# Patient Record
Sex: Female | Born: 1965 | Race: White | Hispanic: No | Smoking: Current every day smoker
Health system: Southern US, Community
[De-identification: ages and names within clinical notes are randomized; demographics above are authoritative.]

## PROBLEM LIST (undated history)

## (undated) DIAGNOSIS — G43909 Migraine, unspecified, not intractable, without status migrainosus: Secondary | ICD-10-CM

## (undated) DIAGNOSIS — R519 Headache, unspecified: Secondary | ICD-10-CM

## (undated) DIAGNOSIS — E785 Hyperlipidemia, unspecified: Secondary | ICD-10-CM

## (undated) DIAGNOSIS — E119 Type 2 diabetes mellitus without complications: Secondary | ICD-10-CM

## (undated) DIAGNOSIS — R51 Headache: Secondary | ICD-10-CM

## (undated) DIAGNOSIS — F329 Major depressive disorder, single episode, unspecified: Secondary | ICD-10-CM

## (undated) DIAGNOSIS — F32A Depression, unspecified: Secondary | ICD-10-CM

## (undated) DIAGNOSIS — F419 Anxiety disorder, unspecified: Secondary | ICD-10-CM

## (undated) DIAGNOSIS — R232 Flushing: Secondary | ICD-10-CM

## (undated) DIAGNOSIS — K219 Gastro-esophageal reflux disease without esophagitis: Secondary | ICD-10-CM

## (undated) HISTORY — PX: COLONOSCOPY: SHX174

## (undated) HISTORY — DX: Major depressive disorder, single episode, unspecified: F32.9

## (undated) HISTORY — DX: Headache: R51

## (undated) HISTORY — DX: Hyperlipidemia, unspecified: E78.5

## (undated) HISTORY — DX: Migraine, unspecified, not intractable, without status migrainosus: G43.909

## (undated) HISTORY — DX: Depression, unspecified: F32.A

## (undated) HISTORY — PX: FOOT SURGERY: SHX648

## (undated) HISTORY — DX: Flushing: R23.2

## (undated) HISTORY — DX: Anxiety disorder, unspecified: F41.9

## (undated) HISTORY — DX: Gastro-esophageal reflux disease without esophagitis: K21.9

## (undated) HISTORY — DX: Headache, unspecified: R51.9

---

## 2004-04-08 ENCOUNTER — Ambulatory Visit: Payer: Self-pay

## 2008-12-24 ENCOUNTER — Ambulatory Visit: Payer: Self-pay

## 2009-07-01 ENCOUNTER — Ambulatory Visit: Payer: Self-pay | Admitting: Neurology

## 2009-09-02 ENCOUNTER — Ambulatory Visit: Payer: Self-pay

## 2009-09-11 ENCOUNTER — Emergency Department: Payer: Self-pay

## 2009-09-15 ENCOUNTER — Inpatient Hospital Stay: Payer: Self-pay

## 2010-04-03 HISTORY — PX: ABDOMINAL HYSTERECTOMY: SHX81

## 2012-01-11 ENCOUNTER — Ambulatory Visit: Payer: Self-pay | Admitting: Podiatry

## 2013-03-07 ENCOUNTER — Emergency Department: Payer: Self-pay | Admitting: Emergency Medicine

## 2013-03-07 LAB — COMPREHENSIVE METABOLIC PANEL
Albumin: 4 g/dL (ref 3.4–5.0)
Alkaline Phosphatase: 95 U/L
Anion Gap: 9 (ref 7–16)
BUN: 5 mg/dL — ABNORMAL LOW (ref 7–18)
Calcium, Total: 9.5 mg/dL (ref 8.5–10.1)
Creatinine: 0.88 mg/dL (ref 0.60–1.30)
EGFR (African American): >60
Potassium: 3.9 mmol/L (ref 3.5–5.1)
SGPT (ALT): 35 U/L (ref 12–78)
Total Protein: 8.1 g/dL (ref 6.4–8.2)

## 2013-03-07 LAB — TSH: Thyroid Stimulating Horm: 2.14 u[IU]/mL

## 2013-03-07 LAB — CBC
HCT: 43.8 % (ref 35.0–47.0)
HGB: 14.6 g/dL (ref 12.0–16.0)
MCHC: 33.3 g/dL (ref 32.0–36.0)
Platelet: 273 10*3/uL (ref 150–440)
WBC: 8.4 10*3/uL (ref 3.6–11.0)

## 2013-03-07 LAB — DRUG SCREEN, URINE
Barbiturates, Ur Screen: NEGATIVE (ref ?–200)
Cocaine Metabolite,Ur ~~LOC~~: NEGATIVE (ref ?–300)
Opiate, Ur Screen: NEGATIVE (ref ?–300)

## 2013-03-07 LAB — ETHANOL: Ethanol %: <0.003 % (ref 0.000–0.080)

## 2013-03-07 LAB — SALICYLATE LEVEL: Salicylates, Serum: 4.4 mg/dL — ABNORMAL HIGH

## 2013-10-27 ENCOUNTER — Encounter: Payer: Self-pay | Admitting: Specialist

## 2013-11-01 ENCOUNTER — Encounter: Payer: Self-pay | Admitting: Specialist

## 2013-12-02 ENCOUNTER — Encounter: Payer: Self-pay | Admitting: Specialist

## 2013-12-24 ENCOUNTER — Ambulatory Visit: Payer: Self-pay | Admitting: Specialist

## 2013-12-26 ENCOUNTER — Emergency Department: Payer: Self-pay | Admitting: Emergency Medicine

## 2014-01-01 ENCOUNTER — Encounter: Payer: Self-pay | Admitting: Specialist

## 2014-07-24 NOTE — Consult Note (Signed)
PATIENT NAME:  Sonya Evans, Sonya Evans MR#:  [id number] DATE OF BIRTH:  Dec 02, 1965  DATE OF CONSULTATION:  03/07/2013  CONSULTING PHYSICIAN:  Audery AmelJohn T. Landen Knoedler, MD  IDENTIFYING INFORMATION AND REASON FOR CONSULT: This is a 49 year old woman with a history of major depression, came to the Emergency Room complaining of worsening depression. Consultation for psychiatric evaluation.   HISTORY OF PRESENT ILLNESS: Information obtained from the patient and the chart. The patient has been suffering from depression for years. Mood stays down. Thoughts are frequently negative. Energy low. Crying spells not infrequently. Some hopelessness. She has had passive thoughts about wishing that she were dead, but denies having any thoughts of ever actually harming herself and has a lot of clarity about wanting to live for her daughter. She does not report any hallucinations or psychotic symptoms. She has only seen her primary care doctor so far for psychiatric treatment and is taking Celexa, Seroquel, and clonazepam. She has been referred to see a psychiatrist but has not had an appointment yet. Just recently started seeing a Librarian, academicChristian counselor. A recent major stress was a change in her work environment, which has put her in a more stressful situation.   PAST PSYCHIATRIC HISTORY: Treatment for major depression has included medicines such as Wellbutrin and Zoloft and Xanax as well as her current medicines. Denies psychiatric hospitalization. Denies suicide attempts. Denies history of psychotic disorder. Just recently got referred to see mental health professionals.   PAST MEDICAL HISTORY: The patient has irritable bowel syndrome, chronic migraines. No other significant medical problems.   SOCIAL HISTORY: The patient is living alone right now. She had a terrible trauma a few years ago when her son was murdered in a murder suicide by his own friend. Her father apparently died around the same time. She also had a brother who died.  The patient has been depressed since about 2009. She works for  [employer]   FAMILY HISTORY: Mother had a history of depression.   SUBSTANCE ABUSE HISTORY: The patient does not drink regularly, does not have a history of alcohol problems. No history of drug abuse.   REVIEW OF SYSTEMS: Migraine headaches. Back pain. Chronic sadness, low energy, passive hopelessness. Denies hallucinations. Denies suicidal intent or plan. Denies homicidal ideation.   CURRENT MEDICATIONS: Celexa, Klonopin, and Seroquel. I am not clear on the doses. Also p.Evans.n. Phenergan.   ALLERGIES: No known drug allergies.   MENTAL STATUS EXAMINATION: Slightly disheveled woman, looks her stated age, cooperative with the interview but very passive. Made intermittent eye contact. Psychomotor activity was very limited. She did not even sit up from her bed when I talked with her. Speech is quiet and somewhat difficult to understand at times, decreased in total amount. Affect is sad,  blunted, and tearful. Mood stated as depressed. Thoughts appear lucid with no evidence of loosening of associations or delusions. Denies auditory or visual hallucinations. Denies suicidal intent or plan. Has passive thoughts about death but no suicidality, no homicidality. Judgment and insight adequate. Normal intelligence. Alert and oriented x 4.   VITAL SIGNS: Most recent blood pressure 138/70, respirations 18, pulse 78, temperature 98.1.   LABORATORY RESULTS: Drug screen negative. TSH normal. CBC normal. Alcohol undetectable. Chemistry unremarkable.   ASSESSMENT: A 49 year old woman with major depression, severe, but without suicidality and without psychosis, who has been still managing to work but has multiple life stresses. Right now, the patient does not meet criteria for inpatient hospitalization. She does need an increase in the intensity of  her treatment and referral to mental health providers.   TREATMENT PLAN: We have made an appointment for her  to see Dr. Garnetta Buddy here at the hospital practice on Monday. The patient will be provided with this appointment. She can continue to see her Librarian, academic. Supportive and educational therapy was done, reviewing treatment options and encouraging the patient to follow up with treatment. Continue with therapy. Do not stop current medications for now. The patient is not under IVC, and I recommend she can be released from the Emergency Room as she has a safe place to stay.   DIAGNOSIS, PRINCIPAL AND PRIMARY:  AXIS I: Major depression, severe, recurrent.   SECONDARY DIAGNOSES: AXIS I: No further diagnosis.  AXIS II: No diagnosis.  AXIS III: Migraines, irritable bowel syndrome.  AXIS IV: Moderate to severe stress from employment problems and chronic reaction to grief.  AXIS V: Functioning at time of evaluation 57.    ____________________________ Audery Amel, MD jtc:jcm D: 03/07/2013 16:32:14 ET T: 03/07/2013 17:30:27 ET JOB#: [id number]  cc: Audery Amel, MD, <Dictator> Audery Amel MD ELECTRONICALLY SIGNED 03/07/2013 18:45

## 2014-07-24 NOTE — Consult Note (Signed)
Brief Consult Note: Diagnosis: major depression.   Patient was seen by consultant.   Consult note dictated.   Recommend further assessment or treatment.   Discussed with Attending MD.   Comments: Psychiatry: Patient seen. Major depression very sad but denies any actual intent or plan to harm herself and has positive reasons to live. Not needing inpt treatment. [facility]  Electronic Signatures: Clapacs, Jackquline DenmarkJohn T (MD)  (Signed 05-Dec-14 16:25)  Authored: Brief Consult Note   Last Updated: 05-Dec-14 16:25 by Audery Amellapacs, John T (MD)

## 2014-10-02 DIAGNOSIS — F32A Depression, unspecified: Secondary | ICD-10-CM | POA: Insufficient documentation

## 2014-10-02 DIAGNOSIS — F329 Major depressive disorder, single episode, unspecified: Secondary | ICD-10-CM | POA: Insufficient documentation

## 2014-10-02 DIAGNOSIS — M79603 Pain in arm, unspecified: Secondary | ICD-10-CM | POA: Insufficient documentation

## 2014-10-02 DIAGNOSIS — G8929 Other chronic pain: Secondary | ICD-10-CM | POA: Insufficient documentation

## 2014-10-02 DIAGNOSIS — M549 Dorsalgia, unspecified: Secondary | ICD-10-CM

## 2014-10-02 DIAGNOSIS — F419 Anxiety disorder, unspecified: Secondary | ICD-10-CM | POA: Insufficient documentation

## 2015-08-09 ENCOUNTER — Other Ambulatory Visit: Payer: Self-pay | Admitting: Family Medicine

## 2015-08-09 ENCOUNTER — Telehealth: Payer: Self-pay | Admitting: Family Medicine

## 2015-08-09 ENCOUNTER — Encounter: Payer: Self-pay | Admitting: Family Medicine

## 2015-08-09 ENCOUNTER — Ambulatory Visit (INDEPENDENT_AMBULATORY_CARE_PROVIDER_SITE_OTHER): Payer: BLUE CROSS/BLUE SHIELD | Admitting: Family Medicine

## 2015-08-09 VITALS — BP 136/78 | HR 92 | Temp 98.1°F | Resp 16 | Ht 66.0 in | Wt 180.0 lb

## 2015-08-09 DIAGNOSIS — R232 Flushing: Secondary | ICD-10-CM | POA: Insufficient documentation

## 2015-08-09 DIAGNOSIS — N951 Menopausal and female climacteric states: Secondary | ICD-10-CM | POA: Diagnosis not present

## 2015-08-09 DIAGNOSIS — K219 Gastro-esophageal reflux disease without esophagitis: Secondary | ICD-10-CM

## 2015-08-09 DIAGNOSIS — Z720 Tobacco use: Secondary | ICD-10-CM | POA: Diagnosis not present

## 2015-08-09 DIAGNOSIS — M25562 Pain in left knee: Secondary | ICD-10-CM | POA: Diagnosis not present

## 2015-08-09 DIAGNOSIS — F17200 Nicotine dependence, unspecified, uncomplicated: Secondary | ICD-10-CM | POA: Insufficient documentation

## 2015-08-09 DIAGNOSIS — E785 Hyperlipidemia, unspecified: Secondary | ICD-10-CM

## 2015-08-09 DIAGNOSIS — F329 Major depressive disorder, single episode, unspecified: Secondary | ICD-10-CM | POA: Diagnosis not present

## 2015-08-09 DIAGNOSIS — F32A Depression, unspecified: Secondary | ICD-10-CM

## 2015-08-09 MED ORDER — MIRTAZAPINE 45 MG PO TABS: 45.0000 mg | ORAL_TABLET | Freq: Every day | ORAL | Status: DC

## 2015-08-09 MED ORDER — QUETIAPINE FUMARATE ER 150 MG PO TB24: 150.0000 mg | ORAL_TABLET | Freq: Every day | ORAL | Status: DC

## 2015-08-09 MED ORDER — NAPROXEN 500 MG PO TABS: 500.0000 mg | ORAL_TABLET | Freq: Two times a day (BID) | ORAL | Status: DC

## 2015-08-09 MED ORDER — QUETIAPINE FUMARATE 300 MG PO TABS: 300.0000 mg | ORAL_TABLET | Freq: Every day | ORAL | Status: DC

## 2015-08-09 MED ORDER — OMEPRAZOLE 20 MG PO CPDR: 20.0000 mg | DELAYED_RELEASE_CAPSULE | Freq: Every day | ORAL | Status: DC

## 2015-08-09 MED ORDER — GABAPENTIN 100 MG PO CAPS: ORAL_CAPSULE | ORAL | Status: DC

## 2015-08-09 NOTE — Assessment & Plan Note (Signed)
Trial of gabapentin for hot flash symptoms. Start 100mg  nightly and titrate up to 300mg  PRN. Recheck 1 mos for efficacy and titration.

## 2015-08-09 NOTE — Telephone Encounter (Signed)
Per [facility], her insurance won't cover 300mg  tablet if only taking 1/2. Will change to 150mg  tablet.

## 2015-08-09 NOTE — Assessment & Plan Note (Signed)
Unclear if patient is still following with psychiatry. Will fill medications today. Strongly urged patient to follow with psychiatry given her current medications.  Check TSH, Vitamin D for exacerbating symptoms.

## 2015-08-09 NOTE — Telephone Encounter (Signed)
Maggie from [facility] need  Clarification  On  Medicine . Call back  #is  [phone]

## 2015-08-09 NOTE — Progress Notes (Signed)
Subjective:    Patient ID: Sonya Evans, female    DOB: Apr 28, 1965, 50 y.o.   MRN: [id number]  HPI: Sonya Evans is a 50 y.o. female presenting on 08/09/2015 for Establish Care   HPI  Pt presents to establish care today. Previous care provider was Dr. Jeananne Rama [phone].  It has been 2 years years since Her last PCP visit. Records from previous provider will be requested and reviewed. Current medical problems include:  Depression: Was seeing Dr. Miles Costain at [facility]- was started on Seroquel and Remeron- she reports from Dr. Jeananne Rama. She was previously clonazepam for depression. Medications were started when son was killed. Pt reports Dr. Miles Costain told her she must get a primary care provider to prescribe her medications. It is unclear if she will continue to follow with psychiatry.  Insomnia- takes seroquel for bedtime at 7pm. Drinks 1 soda at 5pm.  Wakes up in the middle of night with hot flashes. Hot flashes are 3 at night. 2-3 at night. 3 during the day. Hot flashes started 2014. Have stayed about the same. Has tried black cohash- made it worse. Had taken clonidine for hot flashes- did not help.   Acid reflux- Drinks or eats something it burns. Taking nexium 24 hour OTC- seems to help. Some dysphagia. No regurg. No blood in stool or vomit.   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  Last [facility] smear 2014/2015[facility]- Not seeing [facility].  Colonoscopy: Rectal bleeding- was told she had IBS- did an endoscopy and colonoscopy- had a polyp in stomach- was removed. Normal colonoscopy.     Past Medical History  Diagnosis Date  . Depression   . Headache   . Anxiety   . Acid reflux   . Hyperlipidemia   . Migraine   . Hot flashes    Social History   Social History  . Marital Status: Divorced    Spouse Name: N/A  . Number of Children: N/A  . Years of Education: N/A   Occupational History  . Not on file.   Social History Main Topics  .  Smoking status: Current Every Day Smoker -- 1.00 packs/day    Types: Cigarettes  . Smokeless tobacco: Not on file  . Alcohol Use: No  . Drug Use: No  . Sexual Activity: Not on file   Other Topics Concern  . Not on file   Social History Narrative  . No narrative on file   Family History  Problem Relation Age of Onset  . Hyperlipidemia Mother   . Neuropathy Mother   . Arthritis Father   . Hyperlipidemia Brother   . Cancer Maternal Grandmother     breast   . Alzheimer's disease Paternal Grandmother    No current outpatient prescriptions on file prior to visit.   No current facility-administered medications on file prior to visit.    Review of Systems  Constitutional: Negative for fever and chills.  HENT: Negative.   Respiratory: Negative for cough, chest tightness and wheezing.   Cardiovascular: Negative for chest pain and leg swelling.  Gastrointestinal: Negative for nausea, vomiting, abdominal pain, diarrhea and constipation.       Acid reflux.   Endocrine: Negative.  Negative for cold intolerance, heat intolerance, polydipsia, polyphagia and polyuria.       Hot flashes  Genitourinary: Negative for dysuria and difficulty urinating.  Musculoskeletal: Negative.   Neurological: Negative for dizziness, light-headedness and numbness.  Psychiatric/Behavioral: Positive for dysphoric mood.  Per HPI unless specifically indicated above     Objective:    BP 136/78 mmHg  Pulse 92  Temp(Src) 98.1 F (36.7 C) (Oral)  Resp 16  Ht '5\' 6"'$  (1.676 m)  Wt 180 lb (81.647 kg)  BMI 29.07 kg/m2  Wt Readings from Last 3 Encounters:  08/09/15 180 lb (81.647 kg)    Physical Exam  Constitutional: She is oriented to person, place, and time. She appears well-developed and well-nourished.  HENT:  Head: Normocephalic and atraumatic.  Neck: Neck supple.  Cardiovascular: Normal rate, regular rhythm and normal heart sounds.  Exam reveals no gallop and no friction rub.   No murmur  heard. Pulmonary/Chest: Effort normal and breath sounds normal. She has no wheezes. She exhibits no tenderness.  Abdominal: Soft. Normal appearance and bowel sounds are normal. She exhibits no distension and no mass. There is no tenderness. There is no rebound and no guarding.  Musculoskeletal: Normal range of motion. She exhibits no edema or tenderness.  Lymphadenopathy:    She has no cervical adenopathy.  Neurological: She is alert and oriented to person, place, and time.  Skin: Skin is warm and dry.  Psychiatric: She has a normal mood and affect. Her speech is normal and behavior is normal. Judgment and thought content normal. Cognition and memory are normal.   Results for orders placed or performed in visit on 03/07/13  Drug Screen, Urine  Result Value Ref Range   Tricyclic, Ur Screen NEGATIVE Cutoff-1000 ng/mL   Amphetamines, Ur Screen NEGATIVE Cutoff-1000 ng/mL   MDMA (Ecstasy)Ur Screen NEGATIVE Cutoff-500 ng/mL   Cocaine Metabolite,Ur [place] NEGATIVE Cutoff-300 ng/mL   Opiate, Ur Screen NEGATIVE Cutoff-300 ng/mL   Phencyclidine (PCP) Ur S NEGATIVE Cutoff-25 ng/mL   Cannabinoid 50 Ng, Ur [place] NEGATIVE Cutoff-50 ng/mL   Barbiturates, Ur Screen NEGATIVE Cutoff-200 ng/mL   Methadone, Ur Screen NEGATIVE Cutoff-300 ng/mL   Benzodiazepine, Ur Scrn NEGATIVE Cutoff-200 ng/mL  Acetaminophen level  Result Value Ref Range   Acetaminophen < 2 mcg/mL  Comprehensive metabolic panel  Result Value Ref Range   Glucose 92 65-99 mg/dL   BUN 5 (L) 7-18 mg/dL   Creatinine 0.88 0.60-1.30 mg/dL   Sodium 138 136-145 mmol/L   Potassium 3.9 3.5-5.1 mmol/L   Chloride 104 98-107 mmol/L   Co2 25 21-32 mmol/L   Calcium, Total 9.5 8.5-10.1 mg/dL   SGOT(AST) 32 15-37 Unit/L   SGPT (ALT) 35 12-78 U/L   Alkaline Phosphatase 95 Unit/L   Albumin 4.0 3.4-5.0 g/dL   Total Protein 8.1 6.4-8.2 g/dL   Bilirubin,Total 0.3 0.2-1.0 mg/dL   Osmolality 273 275-301   Anion Gap 9 7-16   EGFR (African American) >60     EGFR (Non-African Amer.) >60   Ethanol  Result Value Ref Range   Ethanol < 3 mg/dL   Ethanol % < 0.003 0.000-0.080 %  CBC  Result Value Ref Range   WBC 8.4 3.6-11.0 x10 3/mm 3   RBC 5.15 3.80-5.20 X10 6/mm 3   HGB 14.6 12.0-16.0 g/dL   HCT 43.8 35.0-47.0 %   MCV 85 80-100 fL   MCH 28.3 26.0-34.0 pg   MCHC 33.3 32.0-36.0 g/dL   RDW 15.2 (H) 11.5-14.5 %   Platelet 273 607-371 G62 3/mm 3  Salicylate level  Result Value Ref Range   Salicylates, Serum 4.4 (H) mg/dL  TSH  Result Value Ref Range   Thyroid Stimulating Horm 2.14 uIU/mL      Assessment & Plan:   Problem List Items Addressed This  Visit      Digestive   GERD (gastroesophageal reflux disease)    Start PPI today. Avoid diet triggers. Check CBC. Alarm symptoms reviewed. Consider EGD with continued symptoms. Recheck 1 mos.       Relevant Medications   omeprazole (PRILOSEC) 20 MG capsule   Other Relevant Orders   CBC with Differential/Platelet   Comprehensive metabolic panel     Other   HLD (hyperlipidemia)    Check baseline lipid panel.       Relevant Orders   Lipid Profile   Clinical depression - Primary    Unclear if patient is still following with psychiatry. Will fill medications today. Strongly urged patient to follow with psychiatry given her current medications.  Check TSH, Vitamin D for exacerbating symptoms.       Relevant Medications   mirtazapine (REMERON) 45 MG tablet   Other Relevant Orders   TSH   VITAMIN D 25 Hydroxy (Vit-D Deficiency, Fractures)   Hot flashes    Trial of gabapentin for hot flash symptoms. Start '100mg'$  nightly and titrate up to '300mg'$  PRN. Recheck 1 mos for efficacy and titration.       Relevant Medications   gabapentin (NEURONTIN) 100 MG capsule   Smoker    Encouraged pt to quit smoking.        Other Visit Diagnoses    Lateral knee pain, left        Relevant Medications    naproxen (NAPROSYN) 500 MG tablet       Meds ordered this encounter  Medications  .  DISCONTD: QUEtiapine (SEROQUEL) 300 MG tablet    Sig: Take 300 mg by mouth at bedtime. Pt takes only half pill at bed time.  Marland Kitchen DISCONTD: QUEtiapine (SEROQUEL) 300 MG tablet    Sig: Take 1 tablet (300 mg total) by mouth at bedtime. Pt takes only half pill at bed time.    Dispense:  30 tablet    Refill:  11    Order Specific Question:  Supervising Provider    Answer:  Arlis Porta [phone]  . mirtazapine (REMERON) 45 MG tablet    Sig: Take 1 tablet (45 mg total) by mouth daily.    Dispense:  30 tablet    Refill:  1    Order Specific Question:  Supervising Provider    Answer:  Arlis Porta [phone]  . omeprazole (PRILOSEC) 20 MG capsule    Sig: Take 1 capsule (20 mg total) by mouth daily.    Dispense:  30 capsule    Refill:  3    Order Specific Question:  Supervising Provider    Answer:  Arlis Porta [phone]  . gabapentin (NEURONTIN) 100 MG capsule    Sig: Take '300mg'$  at bedtime.    Dispense:  90 capsule    Refill:  3    Order Specific Question:  Supervising Provider    Answer:  Arlis Porta [phone]  . naproxen (NAPROSYN) 500 MG tablet    Sig: Take 1 tablet (500 mg total) by mouth 2 (two) times daily with a meal.    Dispense:  30 tablet    Refill:  0    Order Specific Question:  Supervising Provider    Answer:  Arlis Porta [[id number]]      Follow up plan: Return in about 4 weeks (around 09/06/2015), or if symptoms worsen or fail to improve, for hot flashes.Marland Kitchen

## 2015-08-09 NOTE — Assessment & Plan Note (Signed)
Check baseline lipid panel.  

## 2015-08-09 NOTE — Assessment & Plan Note (Signed)
Encouraged pt to quit smoking

## 2015-08-09 NOTE — Patient Instructions (Signed)
Hot flashes: Take 100mg  at bedtime. Increase by 1 capsule every 3 days as needed for hot flashes until you are taking 3 capsules at bedtime. Can add 1 capsule in the morning after 1 week if needed.   We will check some labwork. I have filled your seroquel and remeron as needed for depression.

## 2015-08-09 NOTE — Assessment & Plan Note (Signed)
Start PPI today. Avoid diet triggers. Check CBC. Alarm symptoms reviewed. Consider EGD with continued symptoms. Recheck 1 mos.

## 2015-08-10 LAB — LIPID PANEL
CHOLESTEROL TOTAL: 259 mg/dL — AB (ref 100–199)
Chol/HDL Ratio: 6.6 ratio units — ABNORMAL HIGH (ref 0.0–4.4)
HDL: 39 mg/dL — ABNORMAL LOW (ref >39)
LDL CALC: 157 mg/dL — AB (ref 0–99)
TRIGLYCERIDES: 314 mg/dL — AB (ref 0–149)
VLDL Cholesterol Cal: 63 mg/dL — ABNORMAL HIGH (ref 5–40)

## 2015-08-10 LAB — COMPREHENSIVE METABOLIC PANEL
A/G RATIO: 1.7 (ref 1.2–2.2)
ALBUMIN: 4.6 g/dL (ref 3.5–5.5)
ALT: 13 IU/L (ref 0–32)
AST: 18 IU/L (ref 0–40)
Alkaline Phosphatase: 133 IU/L — ABNORMAL HIGH (ref 39–117)
BUN/Creatinine Ratio: 6 — ABNORMAL LOW (ref 9–23)
BUN: 5 mg/dL — ABNORMAL LOW (ref 6–24)
Bilirubin Total: 0.3 mg/dL (ref 0.0–1.2)
CALCIUM: 9.4 mg/dL (ref 8.7–10.2)
CO2: 22 mmol/L (ref 18–29)
CREATININE: 0.77 mg/dL (ref 0.57–1.00)
Chloride: 98 mmol/L (ref 96–106)
GFR, EST AFRICAN AMERICAN: 106 mL/min/{1.73_m2} (ref >59)
GFR, EST NON AFRICAN AMERICAN: 92 mL/min/{1.73_m2} (ref >59)
GLOBULIN, TOTAL: 2.7 g/dL (ref 1.5–4.5)
GLUCOSE: 98 mg/dL (ref 65–99)
Potassium: 4.6 mmol/L (ref 3.5–5.2)
SODIUM: 139 mmol/L (ref 134–144)
TOTAL PROTEIN: 7.3 g/dL (ref 6.0–8.5)

## 2015-08-10 LAB — CBC WITH DIFFERENTIAL/PLATELET
BASOS ABS: 0.1 10*3/uL (ref 0.0–0.2)
BASOS: 1 %
EOS (ABSOLUTE): 0.2 10*3/uL (ref 0.0–0.4)
Eos: 2 %
HEMOGLOBIN: 14.3 g/dL (ref 11.1–15.9)
Hematocrit: 42.3 % (ref 34.0–46.6)
IMMATURE GRANS (ABS): 0 10*3/uL (ref 0.0–0.1)
IMMATURE GRANULOCYTES: 0 %
LYMPHS: 41 %
Lymphocytes Absolute: 3.7 10*3/uL — ABNORMAL HIGH (ref 0.7–3.1)
MCH: 28.5 pg (ref 26.6–33.0)
MCHC: 33.8 g/dL (ref 31.5–35.7)
MCV: 84 fL (ref 79–97)
MONOCYTES: 7 %
Monocytes Absolute: 0.6 10*3/uL (ref 0.1–0.9)
NEUTROS ABS: 4.5 10*3/uL (ref 1.4–7.0)
NEUTROS PCT: 49 %
PLATELETS: 303 10*3/uL (ref 150–379)
RBC: 5.02 x10E6/uL (ref 3.77–5.28)
RDW: 14.1 % (ref 12.3–15.4)
WBC: 9 10*3/uL (ref 3.4–10.8)

## 2015-08-10 LAB — VITAMIN D 25 HYDROXY (VIT D DEFICIENCY, FRACTURES): Vit D, 25-Hydroxy: 24.1 ng/mL — ABNORMAL LOW (ref 30.0–100.0)

## 2015-08-10 LAB — TSH: TSH: 1.9 u[IU]/mL (ref 0.450–4.500)

## 2015-08-11 ENCOUNTER — Other Ambulatory Visit: Payer: Self-pay | Admitting: Family Medicine

## 2015-08-11 MED ORDER — VITAMIN D (ERGOCALCIFEROL) 1.25 MG (50000 UNIT) PO CAPS: 50000.0000 [IU] | ORAL_CAPSULE | ORAL | Status: DC

## 2015-08-12 ENCOUNTER — Other Ambulatory Visit: Payer: Self-pay | Admitting: Family Medicine

## 2015-08-12 MED ORDER — ATORVASTATIN CALCIUM 20 MG PO TABS: 20.0000 mg | ORAL_TABLET | Freq: Every day | ORAL | Status: DC

## 2015-08-13 LAB — SPECIMEN STATUS REPORT

## 2015-08-13 LAB — GAMMA GT: GGT: 49 IU/L (ref 0–60)

## 2015-09-11 ENCOUNTER — Other Ambulatory Visit: Payer: Self-pay | Admitting: Family Medicine

## 2015-09-16 ENCOUNTER — Ambulatory Visit (INDEPENDENT_AMBULATORY_CARE_PROVIDER_SITE_OTHER): Payer: BLUE CROSS/BLUE SHIELD | Admitting: Family Medicine

## 2015-09-16 VITALS — BP 124/74 | HR 87 | Temp 98.6°F | Resp 16 | Ht 66.0 in | Wt 185.0 lb

## 2015-09-16 DIAGNOSIS — N898 Other specified noninflammatory disorders of vagina: Secondary | ICD-10-CM

## 2015-09-16 DIAGNOSIS — R748 Abnormal levels of other serum enzymes: Secondary | ICD-10-CM

## 2015-09-16 DIAGNOSIS — M25561 Pain in right knee: Secondary | ICD-10-CM | POA: Diagnosis not present

## 2015-09-16 DIAGNOSIS — Z7251 High risk heterosexual behavior: Secondary | ICD-10-CM

## 2015-09-16 DIAGNOSIS — M25562 Pain in left knee: Secondary | ICD-10-CM | POA: Diagnosis not present

## 2015-09-16 DIAGNOSIS — N951 Menopausal and female climacteric states: Secondary | ICD-10-CM

## 2015-09-16 DIAGNOSIS — R202 Paresthesia of skin: Secondary | ICD-10-CM

## 2015-09-16 DIAGNOSIS — R232 Flushing: Secondary | ICD-10-CM

## 2015-09-16 LAB — POCT WET PREP (WET MOUNT): CLUE CELLS WET PREP WHIFF POC: NEGATIVE

## 2015-09-16 MED ORDER — MELOXICAM 15 MG PO TABS: 15.0000 mg | ORAL_TABLET | Freq: Every day | ORAL | Status: DC

## 2015-09-16 NOTE — Progress Notes (Signed)
Subjective:    Patient ID: Sonya Evans, female    DOB: 11/11/1965, 50 y.o.   MRN: [id number]  HPI: Sonya Evans is a 50 y.o. female presenting on 09/16/2015 for Hot Flashes   HPI  Pt presents for hot flash follow-up. She has started on gabapentin for hot flashes. Seems to have improve. Has 1-2 at night. Much easier to get back to sleep.  Burning pain in legs started on Monday. Goes from knee down. L knee pain for 3 mos. R knee started last week. Pain occurred on Monday. Walks a lot in her job. Recently got her job in July 1 of last year.  Vaginal odor- fishy odor since January. No discharge. New sexual partner in January- he was not monogamous with her. Not currently active. Is concerned she might have trich.   Past Medical History  Diagnosis Date  . Depression   . Headache   . Anxiety   . Acid reflux   . Hyperlipidemia   . Migraine   . Hot flashes     Current Outpatient Prescriptions on File Prior to Visit  Medication Sig  . atorvastatin (LIPITOR) 20 MG tablet Take 1 tablet (20 mg total) by mouth daily.  Marland Kitchen. gabapentin (NEURONTIN) 100 MG capsule Take 300mg  at bedtime.  . mirtazapine (REMERON) 45 MG tablet TAKE 1 TABLET BY MOUTH ONCE DAILY  . omeprazole (PRILOSEC) 20 MG capsule Take 1 capsule (20 mg total) by mouth daily.  . QUEtiapine Fumarate (SEROQUEL XR) 150 MG 24 hr tablet Take 1 tablet (150 mg total) by mouth at bedtime.  . Vitamin D, Ergocalciferol, (DRISDOL) 50000 units CAPS capsule Take 1 capsule (50,000 Units total) by mouth every 7 (seven) days.   No current facility-administered medications on file prior to visit.    Review of Systems Per HPI unless specifically indicated above     Objective:    BP 124/74 mmHg  Pulse 87  Temp(Src) 98.6 F (37 C) (Oral)  Resp 16  Ht 5\' 6"  (1.676 m)  Wt 185 lb (83.915 kg)  BMI 29.87 kg/m2  Wt Readings from Last 3 Encounters:  09/16/15 185 lb (83.915 kg)  08/09/15 180 lb (81.647 kg)    Physical Exam    Constitutional: She is oriented to person, place, and time. She appears well-developed and well-nourished.  HENT:  Head: Normocephalic and atraumatic.  Neck: Neck supple.  Cardiovascular: Normal rate, regular rhythm and normal heart sounds.  Exam reveals no gallop and no friction rub.   No murmur heard. Pulmonary/Chest: Effort normal and breath sounds normal. She has no wheezes. She exhibits no tenderness.  Abdominal: Soft. Normal appearance and bowel sounds are normal. She exhibits no distension and no mass. There is no tenderness. There is no rebound and no guarding.  Genitourinary: There is no tenderness or lesion on the right labia. There is no rash, tenderness or lesion on the left labia. No tenderness or bleeding in the vagina. Vaginal discharge (white discharge.) found.  Uterus and cervix surgically absent.   Musculoskeletal: Normal range of motion. She exhibits no edema or tenderness.  Lymphadenopathy:    She has no cervical adenopathy.  Neurological: She is alert and oriented to person, place, and time.  Skin: Skin is warm and dry.   Results for orders placed or performed in visit on 09/16/15  POCT Wet Prep [facility]([place] [place])  Result Value Ref Range   Source Wet Prep POC     WBC, Wet Prep HPF POC none  Bacteria Wet Prep HPF POC None None, Few, Too numerous to count   BACTERIA WET PREP MORPHOLOGY POC     Clue Cells Wet Prep HPF POC None None, Too numerous to count   Clue Cells Wet Prep Whiff POC Negative Whiff    Yeast Wet Prep HPF POC None    KOH Wet Prep POC     Trichomonas Wet Prep HPF POC        Assessment & Plan:   Problem List Items Addressed This Visit      Other   Hot flashes    Much improved on nightly gabapentin.        Other Visit Diagnoses    Paresthesia    -  Primary    Check B12. Consider increasing gabapentin.     Relevant Orders    Vitamin B12    Folate    Arthralgia of both knees        Add meloxicam. If not improving, will send her to ortho.      Relevant Medications    meloxicam (MOBIC) 15 MG tablet    Elevated alkaline phosphatase level        Recheck hepatic function.     Relevant Orders    Hepatic function panel    Vaginal discharge        Wet prep negative. Pt is concerned about trich- will send out swab.     Relevant Orders    POCT Wet Prep [facility]) (Completed)    Trichomonas vaginalis, RNA    Risky sexual behavior        Relevant Orders    Trichomonas vaginalis, RNA       Meds ordered this encounter  Medications  . meloxicam (MOBIC) 15 MG tablet    Sig: Take 1 tablet (15 mg total) by mouth daily.    Dispense:  30 tablet    Refill:  0    Order Specific Question:  Supervising Provider    Answer:  Janeann Forehand [[id number]]      Follow up plan: No Follow-up on file.

## 2015-09-16 NOTE — Patient Instructions (Signed)
Let's try meloxicam 15mg  once daily for your knee pain. If it is not improving, please let me know and we will have you see an orthopedist.   We will check some lab work to determine why you are having burning pain in the legs.  There is no sign of vaginal infection today.

## 2015-09-16 NOTE — Assessment & Plan Note (Signed)
Much improved on nightly gabapentin.

## 2015-09-17 LAB — HEPATIC FUNCTION PANEL
ALK PHOS: 116 U/L — AB (ref 33–115)
ALT: 18 U/L (ref 6–29)
AST: 20 U/L (ref 10–35)
Albumin: 4.5 g/dL (ref 3.6–5.1)
BILIRUBIN INDIRECT: 0.4 mg/dL (ref 0.2–1.2)
Bilirubin, Direct: 0.1 mg/dL (ref <=0.2)
TOTAL PROTEIN: 7.5 g/dL (ref 6.1–8.1)
Total Bilirubin: 0.5 mg/dL (ref 0.2–1.2)

## 2015-09-17 LAB — FOLATE: Folate: 10 ng/mL (ref >5.4)

## 2015-09-17 LAB — TRICHOMONAS VAGINALIS, PROBE AMP: T vaginalis RNA: NEGATIVE

## 2015-09-17 LAB — VITAMIN B12: Vitamin B-12: 435 pg/mL (ref 200–1100)

## 2015-09-22 ENCOUNTER — Telehealth: Payer: Self-pay | Admitting: Family Medicine

## 2015-09-22 NOTE — Telephone Encounter (Signed)
Pt had to call [facility] about a lab order for her 5/8 visit with Amy.  [facility] told her the order had the wrong diagnosis code and [facility] and never received it.  [facility] did not sent a bill to her insurance so they took the money off pt's debit card, all $180.  They also mentioned two accounts [id number] and [phone] and that the were going to take another $350 from her card that's on file.  Can you check to see if [facility] sent a fax over for correction?  Her call back number is [phone]

## 2015-09-22 NOTE — Telephone Encounter (Signed)
[place] to [place] since I dont handle billing. I received nothing from [facility] regarding her labs. The only thing I can think of is they are charging her for add on testing if any was done.  Thanks! Ak

## 2015-09-27 NOTE — Telephone Encounter (Signed)
I have contacted [facility] and patient.  Patient will be refunded her money and they will re file her ins.  Patient notified

## 2015-11-30 ENCOUNTER — Emergency Department: Payer: BLUE CROSS/BLUE SHIELD

## 2015-11-30 ENCOUNTER — Emergency Department
Admission: EM | Admit: 2015-11-30 | Discharge: 2015-11-30 | Disposition: A | Discharge status: Home / Self Care | Payer: BLUE CROSS/BLUE SHIELD | Attending: Emergency Medicine | Admitting: Emergency Medicine

## 2015-11-30 ENCOUNTER — Encounter: Payer: Self-pay | Admitting: Emergency Medicine

## 2015-11-30 DIAGNOSIS — Z79899 Other long term (current) drug therapy: Secondary | ICD-10-CM | POA: Insufficient documentation

## 2015-11-30 DIAGNOSIS — R1084 Generalized abdominal pain: Secondary | ICD-10-CM | POA: Diagnosis not present

## 2015-11-30 DIAGNOSIS — F17210 Nicotine dependence, cigarettes, uncomplicated: Secondary | ICD-10-CM | POA: Diagnosis not present

## 2015-11-30 DIAGNOSIS — R1031 Right lower quadrant pain: Secondary | ICD-10-CM | POA: Diagnosis present

## 2015-11-30 DIAGNOSIS — Z791 Long term (current) use of non-steroidal anti-inflammatories (NSAID): Secondary | ICD-10-CM | POA: Diagnosis not present

## 2015-11-30 LAB — POCT PREGNANCY, URINE: Preg Test, Ur: NEGATIVE

## 2015-11-30 LAB — CBC
HCT: 44.8 % (ref 35.0–47.0)
Hemoglobin: 15.9 g/dL (ref 12.0–16.0)
MCH: 29.6 pg (ref 26.0–34.0)
MCHC: 35.4 g/dL (ref 32.0–36.0)
MCV: 83.5 fL (ref 80.0–100.0)
Platelets: 241 K/uL (ref 150–440)
RBC: 5.37 MIL/uL — ABNORMAL HIGH (ref 3.80–5.20)
RDW: 13.5 % (ref 11.5–14.5)
WBC: 10.3 K/uL (ref 3.6–11.0)

## 2015-11-30 LAB — COMPREHENSIVE METABOLIC PANEL
ALBUMIN: 4.5 g/dL (ref 3.5–5.0)
ALK PHOS: 110 U/L (ref 38–126)
ALT: 23 U/L (ref 14–54)
ANION GAP: 9 (ref 5–15)
AST: 34 U/L (ref 15–41)
BILIRUBIN TOTAL: 0.5 mg/dL (ref 0.3–1.2)
BUN: 7 mg/dL (ref 6–20)
CALCIUM: 9.4 mg/dL (ref 8.9–10.3)
CO2: 23 mmol/L (ref 22–32)
CREATININE: 0.88 mg/dL (ref 0.44–1.00)
Chloride: 107 mmol/L (ref 101–111)
GFR calc Af Amer: >60 mL/min (ref >60)
GFR calc non Af Amer: >60 mL/min (ref >60)
GLUCOSE: 82 mg/dL (ref 65–99)
Potassium: 3.8 mmol/L (ref 3.5–5.1)
Sodium: 139 mmol/L (ref 135–145)
TOTAL PROTEIN: 7.9 g/dL (ref 6.5–8.1)

## 2015-11-30 LAB — URINALYSIS COMPLETE WITH MICROSCOPIC (ARMC ONLY)
Bacteria, UA: NONE SEEN
Bilirubin Urine: NEGATIVE
Glucose, UA: NEGATIVE mg/dL
Ketones, ur: NEGATIVE mg/dL
Leukocytes, UA: NEGATIVE
Nitrite: NEGATIVE
Protein, ur: NEGATIVE mg/dL
RBC / HPF: NONE SEEN RBC/hpf (ref 0–5)
Specific Gravity, Urine: 1.001 — ABNORMAL LOW (ref 1.005–1.030)
pH: 6 (ref 5.0–8.0)

## 2015-11-30 LAB — LIPASE, BLOOD: Lipase: 38 U/L (ref 11–51)

## 2015-11-30 MED ORDER — DIATRIZOATE MEGLUMINE & SODIUM 66-10 % PO SOLN
15.0000 mL | Freq: Once | ORAL | Status: AC
  Administered 2015-11-30: 15 mL via ORAL

## 2015-11-30 MED ORDER — ONDANSETRON HCL 4 MG PO TABS: 4.0000 mg | ORAL_TABLET | Freq: Three times a day (TID) | ORAL | 0 refills | Status: DC | PRN

## 2015-11-30 MED ORDER — PROMETHAZINE HCL 25 MG PO TABS: 25.0000 mg | ORAL_TABLET | Freq: Four times a day (QID) | ORAL | 0 refills | Status: DC | PRN

## 2015-11-30 MED ORDER — IOPAMIDOL (ISOVUE-300) INJECTION 61%
100.0000 mL | Freq: Once | INTRAVENOUS | Status: AC | PRN
  Administered 2015-11-30: 100 mL via INTRAVENOUS
  Filled 2015-11-30: qty 100

## 2015-11-30 MED ORDER — DICYCLOMINE HCL 20 MG PO TABS: 20.0000 mg | ORAL_TABLET | Freq: Three times a day (TID) | ORAL | 0 refills | Status: DC | PRN

## 2015-11-30 MED ORDER — SODIUM CHLORIDE 0.9 % IV BOLUS (SEPSIS)
1000.0000 mL | Freq: Once | INTRAVENOUS | Status: AC
  Administered 2015-11-30: 1000 mL via INTRAVENOUS

## 2015-11-30 NOTE — ED Provider Notes (Signed)
[facility] Emergency Department Provider Note   ____________________________________________   I have reviewed the triage vital signs and the nursing notes.   HISTORY  Chief Complaint Abdominal Pain   History limited by: Not Limited   HPI Sonya Evans is a 50 y.o. female who presents to the emergency department today because of concerns for right lower quadrant pain. She states the pain started this morning. Has been constant. She denies that it started around her belly button. It started in her right lower quadrant. She says that it now radiates up her right side. It is sharp. It is worse with walking. She did not try eating anything today. She denies any fevers. She has had some nausea but no vomiting.   Past Medical History:  Diagnosis Date  . Acid reflux   . Anxiety   . Depression   . Headache   . Hot flashes   . Hyperlipidemia   . Migraine     Patient Active Problem List   Diagnosis Date Noted  . HLD (hyperlipidemia) 08/09/2015  . GERD (gastroesophageal reflux disease) 08/09/2015  . Hot flashes 08/09/2015  . Smoker 08/09/2015  . Anxiety 10/02/2014  . Arm pain 10/02/2014  . Clinical depression 10/02/2014  . Back pain, chronic 10/02/2014    Past Surgical History:  Procedure Laterality Date  . ABDOMINAL HYSTERECTOMY  2012  . FOOT SURGERY      Prior to Admission medications   Medication Sig Start Date End Date Taking? Authorizing Provider  atorvastatin (LIPITOR) 20 MG tablet Take 1 tablet (20 mg total) by mouth daily. 08/12/15   Amy Rusty Aus, NP  gabapentin (NEURONTIN) 100 MG capsule Take 300mg  at bedtime. 08/09/15   Amy Rusty Aus, NP  meloxicam (MOBIC) 15 MG tablet Take 1 tablet (15 mg total) by mouth daily. 09/16/15   Amy Rusty Aus, NP  mirtazapine (REMERON) 45 MG tablet TAKE 1 TABLET BY MOUTH ONCE DAILY 09/13/15   Amy Rusty Aus, NP  omeprazole (PRILOSEC) 20 MG capsule Take 1 capsule (20 mg total) by mouth daily. 08/09/15    Amy Rusty Aus, NP  QUEtiapine Fumarate (SEROQUEL XR) 150 MG 24 hr tablet Take 1 tablet (150 mg total) by mouth at bedtime. 08/09/15   Amy Rusty Aus, NP  Vitamin D, Ergocalciferol, (DRISDOL) 50000 units CAPS capsule Take 1 capsule (50,000 Units total) by mouth every 7 (seven) days. 08/11/15   Amy Rusty Aus, NP    Allergies Desvenlafaxine and Lurasidone  Family History  Problem Relation Age of Onset  . Hyperlipidemia Mother   . Neuropathy Mother   . Arthritis Father   . Hyperlipidemia Brother   . Cancer Maternal Grandmother     breast   . Alzheimer's disease Paternal Grandmother     Social History Social History  Substance Use Topics  . Smoking status: Current Every Day Smoker    Packs/day: 1.00    Types: Cigarettes  . Smokeless tobacco: Never Used  . Alcohol use No    Review of Systems  Constitutional: Negative for fever. Cardiovascular: Negative for chest pain. Respiratory: Negative for shortness of breath. Gastrointestinal: Positive for right lower quadrant abdominal pain. Neurological: Negative for headaches, focal weakness or numbness.  10-point ROS otherwise negative.  ____________________________________________   PHYSICAL EXAM:  VITAL SIGNS: ED Triage Vitals  Enc Vitals Group     BP 11/30/15 1229 132/74     Pulse Rate 11/30/15 1229 86     Resp 11/30/15 1229 18  Temp 11/30/15 1229 97.9 F (36.6 C)     Temp Source 11/30/15 1229 Oral     SpO2 11/30/15 1229 100 %     Weight 11/30/15 1230 188 lb (85.3 kg)     Height 11/30/15 1230 5\' 6"  (1.676 m)     Head Circumference --      Peak Flow --      Pain Score 11/30/15 1231 7   Constitutional: Alert and oriented. Well appearing and in no distress. Eyes: Conjunctivae are normal. Normal extraocular movements. ENT   Head: Normocephalic and atraumatic.   Nose: No congestion/rhinnorhea.   Mouth/Throat: Mucous membranes are moist.   Neck: No stridor. Hematological/Lymphatic/Immunilogical:  No cervical lymphadenopathy. Cardiovascular: Normal rate, regular rhythm.  No murmurs, rubs, or gallops. Respiratory: Normal respiratory effort without tachypnea nor retractions. Breath sounds are clear and equal bilaterally. No wheezes/rales/rhonchi. Gastrointestinal: Soft and tender to palpation in the right lower quadrant.  Genitourinary: Deferred Musculoskeletal: Normal range of motion in all extremities. No joint effusions.  No lower extremity edema. Neurologic:  Normal speech and language. No gross focal neurologic deficits are appreciated.  Skin:  Skin is warm, dry and intact. No rash noted. Psychiatric: Mood and affect are normal. Speech and behavior are normal. Patient exhibits appropriate insight and judgment.  ____________________________________________    LABS (pertinent positives/negatives)  Labs Reviewed  CBC - Abnormal; Notable for the following:       Result Value   RBC 5.37 (*)    All other components within normal limits  URINALYSIS COMPLETEWITH MICROSCOPIC ([facility] ONLY) - Abnormal; Notable for the following:    Color, Urine STRAW (*)    APPearance CLEAR (*)    Specific Gravity, Urine 1.001 (*)    Hgb urine dipstick 2+ (*)    Squamous Epithelial / LPF 0-5 (*)    All other components within normal limits  LIPASE, BLOOD  COMPREHENSIVE METABOLIC PANEL  POCT PREGNANCY, URINE     ____________________________________________   EKG  None  ____________________________________________    RADIOLOGY  CT abd/pel IMPRESSION:  1. No acute findings. No source for right-sided abdominal pain  identified. Appendix is normal. No bowel obstruction or evidence of  bowel wall inflammation. No evidence of acute solid organ  abnormality. No renal or ureteral calculi.  2. Aortic atherosclerosis.  3. Additional chronic/incidental findings detailed above.     ____________________________________________   PROCEDURES  Procedures  ____________________________________________   INITIAL IMPRESSION / ASSESSMENT AND PLAN / ED COURSE  Pertinent labs & imaging results that were available during my care of the patient were reviewed by me and considered in my medical decision making (see chart for details).  Patient presents to the emergency department today because of concerns for right lower quadrant abdominal pain. Patient does not have any fevers or leukocytosis here. The pain did not start periumbilically. However she does have tenderness in the right lower quadrant. I discussed with the patient risks and benefits of ct scan in this setting. She did choose to go ahead with CT scan.  Clinical Course   CT scan without any concerning findings. No signs of appendicitis. Will plan on discharging with bentyl and zofran.  ____________________________________________   FINAL CLINICAL IMPRESSION(S) / ED DIAGNOSES  Final diagnoses:  Generalized abdominal pain     Note: This dictation was prepared with Dragon dictation. Any transcriptional errors that result from this process are unintentional    Phineas SemenGraydon Lani Mendiola, MD 11/30/15 1810

## 2015-11-30 NOTE — Discharge Instructions (Signed)
Please seek medical attention for any high fevers, chest pain, shortness of breath, change in behavior, persistent vomiting, bloody stool or any other new or concerning symptoms.  

## 2015-11-30 NOTE — ED Triage Notes (Signed)
Pt complains of abdominal pain to right side that started around 1030 this AM. Pt complains of nausea.

## 2015-12-13 ENCOUNTER — Other Ambulatory Visit: Payer: Self-pay | Admitting: Family Medicine

## 2015-12-13 DIAGNOSIS — R232 Flushing: Secondary | ICD-10-CM

## 2016-01-07 ENCOUNTER — Other Ambulatory Visit: Payer: Self-pay | Admitting: Family Medicine

## 2016-02-12 DIAGNOSIS — R079 Chest pain, unspecified: Secondary | ICD-10-CM | POA: Diagnosis not present

## 2016-02-12 DIAGNOSIS — R0789 Other chest pain: Secondary | ICD-10-CM | POA: Diagnosis not present

## 2016-02-12 DIAGNOSIS — S299XXA Unspecified injury of thorax, initial encounter: Secondary | ICD-10-CM | POA: Diagnosis not present

## 2016-02-14 ENCOUNTER — Other Ambulatory Visit: Payer: Self-pay | Admitting: Family Medicine

## 2016-02-14 DIAGNOSIS — R232 Flushing: Secondary | ICD-10-CM

## 2016-02-14 DIAGNOSIS — K219 Gastro-esophageal reflux disease without esophagitis: Secondary | ICD-10-CM

## 2016-02-15 MED ORDER — OMEPRAZOLE 20 MG PO CPDR: 20.0000 mg | DELAYED_RELEASE_CAPSULE | Freq: Every day | ORAL | 3 refills | Status: DC

## 2016-02-15 MED ORDER — GABAPENTIN 100 MG PO CAPS: 300.0000 mg | ORAL_CAPSULE | Freq: Every day | ORAL | 3 refills | Status: DC

## 2016-03-14 DIAGNOSIS — K0263 Dental caries on smooth surface penetrating into pulp: Secondary | ICD-10-CM | POA: Diagnosis not present

## 2016-04-04 DIAGNOSIS — M65311 Trigger thumb, right thumb: Secondary | ICD-10-CM | POA: Diagnosis not present

## 2016-04-06 ENCOUNTER — Other Ambulatory Visit: Payer: Self-pay | Admitting: Family Medicine

## 2016-04-06 MED ORDER — MIRTAZAPINE 45 MG PO TABS: 45.0000 mg | ORAL_TABLET | Freq: Every day | ORAL | 5 refills | Status: DC

## 2016-04-26 DIAGNOSIS — R1032 Left lower quadrant pain: Secondary | ICD-10-CM | POA: Diagnosis not present

## 2016-04-26 DIAGNOSIS — R1031 Right lower quadrant pain: Secondary | ICD-10-CM | POA: Diagnosis not present

## 2016-04-26 DIAGNOSIS — K625 Hemorrhage of anus and rectum: Secondary | ICD-10-CM | POA: Diagnosis not present

## 2016-04-27 ENCOUNTER — Ambulatory Visit (INDEPENDENT_AMBULATORY_CARE_PROVIDER_SITE_OTHER): Payer: BLUE CROSS/BLUE SHIELD | Admitting: General Surgery

## 2016-04-27 ENCOUNTER — Encounter: Payer: Self-pay | Admitting: General Surgery

## 2016-04-27 VITALS — BP 132/76 | HR 72 | Temp 98.7°F | Resp 12 | Ht 66.0 in | Wt 189.0 lb

## 2016-04-27 DIAGNOSIS — K625 Hemorrhage of anus and rectum: Secondary | ICD-10-CM

## 2016-04-27 MED ORDER — POLYETHYLENE GLYCOL 3350 17 GM/SCOOP PO POWD: ORAL | 0 refills | Status: DC

## 2016-04-27 NOTE — Progress Notes (Signed)
Patient ID: Sonya Evans, female   DOB: [birth year], 51 y.o.   MRN: [id number]  Chief Complaint  Patient presents with  . Rectal Bleeding    HPI Sonya Evans is a 51 y.o. female here today for an evaluation rectal bleeding. Reports on Tuesday she was vomiting and had diarrhea. Patient additionally noticed some bright red blood. She states the blood was on the paper and in the bowl. States that she has been having diffuse lower abdominal pain. Reports that she is still having nausea and has no appetite. I have reviewed the history of present illness with the patient.  HPI   Past Medical History:  Diagnosis Date  . Acid reflux   . Anxiety   . Depression   . Headache   . Hot flashes   . Hyperlipidemia   . Migraine     Past Surgical History:  Procedure Laterality Date  . ABDOMINAL HYSTERECTOMY  2012  . COLONOSCOPY    . FOOT SURGERY      Family History  Problem Relation Age of Onset  . Hyperlipidemia Mother   . Neuropathy Mother   . Arthritis Father   . Hyperlipidemia Brother   . Cancer Maternal Grandmother     breast   . Alzheimer's disease Paternal Grandmother     Social History Social History  Substance Use Topics  . Smoking status: Current Every Day Smoker    Packs/day: 1.00    Types: Cigarettes  . Smokeless tobacco: Never Used  . Alcohol use No    Allergies  Allergen Reactions  . Desvenlafaxine Nausea And Vomiting  . Lurasidone Palpitations and Nausea And Vomiting    Current Outpatient Prescriptions  Medication Sig Dispense Refill  . atorvastatin (LIPITOR) 20 MG tablet Take 1 tablet (20 mg total) by mouth daily. 30 tablet 11  . meloxicam (MOBIC) 15 MG tablet Take 1 tablet (15 mg total) by mouth daily. 30 tablet 0  . mirtazapine (REMERON) 45 MG tablet Take 1 tablet (45 mg total) by mouth daily. 30 tablet 5  . omeprazole (PRILOSEC) 20 MG capsule Take 1 capsule (20 mg total) by mouth daily. 30 capsule 3  . ondansetron (ZOFRAN) 4 MG tablet Take 1  tablet (4 mg total) by mouth every 8 (eight) hours as needed for nausea or vomiting. 20 tablet 0  . promethazine (PHENERGAN) 25 MG tablet Take 1 tablet (25 mg total) by mouth every 6 (six) hours as needed for nausea or vomiting. 15 tablet 0  . QUEtiapine Fumarate (SEROQUEL XR) 150 MG 24 hr tablet Take 1 tablet (150 mg total) by mouth at bedtime. 30 tablet 0  . Vitamin D, Ergocalciferol, (DRISDOL) 50000 units CAPS capsule TAKE 1 CAPSULE BY MOUTH EVERY 7 DAYS 12 capsule 0  . polyethylene glycol powder (GLYCOLAX/MIRALAX) powder 255 grams one bottle for colonoscopy prep 255 g 0   No current facility-administered medications for this visit.     Review of Systems Review of Systems  Constitutional: Negative.   Respiratory: Negative.   Cardiovascular: Negative.     Blood pressure 132/76, pulse 72, temperature 98.7 F (37.1 C), temperature source Oral, resp. rate 12, height 5\' 6"  (1.676 m), weight 189 lb (85.7 kg).  Physical Exam Physical Exam  Constitutional: She is oriented to person, place, and time. She appears well-developed and well-nourished.  Eyes: Conjunctivae are normal. No scleral icterus.  Neck: Neck supple.  Cardiovascular: Normal rate, regular rhythm and normal heart sounds.   Pulmonary/Chest: Effort normal and breath sounds normal.  Abdominal: Soft. Bowel sounds are normal. There is no tenderness.  Lymphadenopathy:    She has no cervical adenopathy.  Neurological: She is alert and oriented to person, place, and time.  Skin: Skin is warm and dry.  Psychiatric: She has a normal mood and affect.    Data Reviewed Notes reviewed   Assessment    Rectal bleeding    Plan       Colonoscopy with possible biopsy/polypectomy prn: Information regarding the procedure, including its potential risks and complications (including but not limited to perforation of the bowel, which may require emergency surgery to repair, and bleeding) was verbally given to the patient. Educational  information regarding lower intestinal endoscopy was given to the patient. Written instructions for how to complete the bowel prep using Miralax were provided. The importance of drinking ample fluids to avoid dehydration as a result of the prep emphasized.  Patient has been scheduled for [facility].   This information has been scribed by Ples SpecterJessica Qualls CMA.   Onetta Spainhower G 05/01/2016, 8:55 AM

## 2016-04-27 NOTE — Patient Instructions (Signed)
Colonoscopy, Adult A colonoscopy is an exam to look at the entire large intestine. During the exam, a lubricated, bendable tube is inserted into the anus and then passed into the rectum, colon, and other parts of the large intestine. A colonoscopy is often done as a part of normal colorectal screening or in response to certain symptoms, such as anemia, persistent diarrhea, abdominal pain, and blood in the stool. The exam can help screen for and diagnose medical problems, including:  Tumors.  Polyps.  Inflammation.  Areas of bleeding. Tell a health care provider about:  Any allergies you have.  All medicines you are taking, including vitamins, herbs, eye drops, creams, and over-the-counter medicines.  Any problems you or family members have had with anesthetic medicines.  Any blood disorders you have.  Any surgeries you have had.  Any medical conditions you have.  Any problems you have had passing stool. What are the risks? Generally, this is a safe procedure. However, problems may occur, including:  Bleeding.  A tear in the intestine.  A reaction to medicines given during the exam.  Infection (rare). What happens before the procedure? Eating and drinking restrictions  Follow instructions from your health care provider about eating and drinking, which may include:  A few days before the procedure - follow a low-fiber diet. Avoid nuts, seeds, dried fruit, raw fruits, and vegetables.  1-3 days before the procedure - follow a clear liquid diet. Drink only clear liquids, such as clear broth or bouillon, black coffee or tea, clear juice, clear soft drinks or sports drinks, gelatin desert, and popsicles. Avoid any liquids that contain red or purple dye.  On the day of the procedure - do not eat or drink anything during the 2 hours before the procedure, or within the time period that your health care provider recommends. Bowel prep  If you were prescribed an oral bowel prep to  clean out your colon:  Take it as told by your health care provider. Starting the day before your procedure, you will need to drink a large amount of medicated liquid. The liquid will cause you to have multiple loose stools until your stool is almost clear or light green.  If your skin or anus gets irritated from diarrhea, you may use these to relieve the irritation:  Medicated wipes, such as adult wet wipes with aloe and vitamin E.  A skin soothing-product like petroleum jelly.  If you vomit while drinking the bowel prep, take a break for up to 60 minutes and then begin the bowel prep again. If vomiting continues and you cannot take the bowel prep without vomiting, call your health care provider. General instructions  Ask your health care provider about changing or stopping your regular medicines. This is especially important if you are taking diabetes medicines or blood thinners.  Plan to have someone take you home from the hospital or clinic. What happens during the procedure?  An IV tube may be inserted into one of your veins.  You will be given medicine to help you relax (sedative).  To reduce your risk of infection:  Your health care team will wash or sanitize their hands.  Your anal area will be washed with soap.  You will be asked to lie on your side with your knees bent.  Your health care provider will lubricate a long, thin, flexible tube. The tube will have a camera and a light on the end.  The tube will be inserted into your anus.    The tube will be gently eased through your rectum and colon.  Air will be delivered into your colon to keep it open. You may feel some pressure or cramping.  The camera will be used to take images during the procedure.  A small tissue sample may be removed from your body to be examined under a microscope (biopsy). If any potential problems are found, the tissue will be sent to a lab for testing.  If small polyps are found, your health  care provider may remove them and have them checked for cancer cells.  The tube that was inserted into your anus will be slowly removed. The procedure may vary among health care providers and hospitals. What happens after the procedure?  Your blood pressure, heart rate, breathing rate, and blood oxygen level will be monitored until the medicines you were given have worn off.  Do not drive for 24 hours after the exam.  You may have a small amount of blood in your stool.  You may pass gas and have mild abdominal cramping or bloating due to the air that was used to inflate your colon during the exam.  It is up to you to get the results of your procedure. Ask your health care provider, or the department performing the procedure, when your results will be ready. This information is not intended to replace advice given to you by your health care provider. Make sure you discuss any questions you have with your health care provider. Document Released: 03/17/2000 Document Revised: 10/08/2015 Document Reviewed: 06/01/2015 Elsevier Interactive Patient Education  2017 [employer]  

## 2016-05-12 ENCOUNTER — Other Ambulatory Visit: Payer: Self-pay | Admitting: General Surgery

## 2016-05-17 ENCOUNTER — Ambulatory Visit: Payer: BLUE CROSS/BLUE SHIELD | Admitting: Anesthesiology

## 2016-05-17 ENCOUNTER — Encounter
Admission: RE | Disposition: A | Discharge status: Home / Self Care | Payer: Self-pay | Source: Ambulatory Visit | Attending: General Surgery

## 2016-05-17 ENCOUNTER — Ambulatory Visit
Admission: RE | Admit: 2016-05-17 | Discharge: 2016-05-17 | Disposition: A | Discharge status: Home / Self Care | Payer: BLUE CROSS/BLUE SHIELD | Source: Ambulatory Visit | Attending: General Surgery | Admitting: General Surgery

## 2016-05-17 DIAGNOSIS — K625 Hemorrhage of anus and rectum: Secondary | ICD-10-CM | POA: Diagnosis not present

## 2016-05-17 DIAGNOSIS — Z9889 Other specified postprocedural states: Secondary | ICD-10-CM | POA: Diagnosis not present

## 2016-05-17 DIAGNOSIS — J449 Chronic obstructive pulmonary disease, unspecified: Secondary | ICD-10-CM | POA: Diagnosis not present

## 2016-05-17 DIAGNOSIS — Z820 Family history of epilepsy and other diseases of the nervous system: Secondary | ICD-10-CM | POA: Diagnosis not present

## 2016-05-17 DIAGNOSIS — Z8349 Family history of other endocrine, nutritional and metabolic diseases: Secondary | ICD-10-CM | POA: Insufficient documentation

## 2016-05-17 DIAGNOSIS — E785 Hyperlipidemia, unspecified: Secondary | ICD-10-CM | POA: Diagnosis not present

## 2016-05-17 DIAGNOSIS — F17210 Nicotine dependence, cigarettes, uncomplicated: Secondary | ICD-10-CM | POA: Insufficient documentation

## 2016-05-17 DIAGNOSIS — Z1211 Encounter for screening for malignant neoplasm of colon: Secondary | ICD-10-CM | POA: Diagnosis not present

## 2016-05-17 DIAGNOSIS — F419 Anxiety disorder, unspecified: Secondary | ICD-10-CM | POA: Insufficient documentation

## 2016-05-17 DIAGNOSIS — Z791 Long term (current) use of non-steroidal anti-inflammatories (NSAID): Secondary | ICD-10-CM | POA: Diagnosis not present

## 2016-05-17 DIAGNOSIS — Z90710 Acquired absence of both cervix and uterus: Secondary | ICD-10-CM | POA: Diagnosis not present

## 2016-05-17 DIAGNOSIS — F329 Major depressive disorder, single episode, unspecified: Secondary | ICD-10-CM | POA: Insufficient documentation

## 2016-05-17 DIAGNOSIS — Z803 Family history of malignant neoplasm of breast: Secondary | ICD-10-CM | POA: Insufficient documentation

## 2016-05-17 DIAGNOSIS — K219 Gastro-esophageal reflux disease without esophagitis: Secondary | ICD-10-CM | POA: Diagnosis not present

## 2016-05-17 DIAGNOSIS — I739 Peripheral vascular disease, unspecified: Secondary | ICD-10-CM | POA: Diagnosis not present

## 2016-05-17 DIAGNOSIS — Z8261 Family history of arthritis: Secondary | ICD-10-CM | POA: Insufficient documentation

## 2016-05-17 DIAGNOSIS — Z888 Allergy status to other drugs, medicaments and biological substances status: Secondary | ICD-10-CM | POA: Insufficient documentation

## 2016-05-17 DIAGNOSIS — Z79899 Other long term (current) drug therapy: Secondary | ICD-10-CM | POA: Insufficient documentation

## 2016-05-17 DIAGNOSIS — K621 Rectal polyp: Secondary | ICD-10-CM | POA: Diagnosis not present

## 2016-05-17 DIAGNOSIS — K635 Polyp of colon: Secondary | ICD-10-CM | POA: Diagnosis not present

## 2016-05-17 HISTORY — PX: COLONOSCOPY WITH PROPOFOL: SHX5780

## 2016-05-17 SURGERY — COLONOSCOPY WITH PROPOFOL
Anesthesia: General

## 2016-05-17 MED ORDER — SODIUM CHLORIDE 0.9 % IV SOLN
INTRAVENOUS | Status: DC
  Administered 2016-05-17: 1000 mL via INTRAVENOUS

## 2016-05-17 MED ORDER — PROPOFOL 500 MG/50ML IV EMUL
INTRAVENOUS | Status: AC
  Filled 2016-05-17: qty 50

## 2016-05-17 MED ORDER — PROPOFOL 500 MG/50ML IV EMUL
INTRAVENOUS | Status: DC | PRN
Start: 2016-05-17 — End: 2016-05-17
  Administered 2016-05-17: 180 ug/kg/min via INTRAVENOUS

## 2016-05-17 MED ORDER — LIDOCAINE HCL (PF) 1 % IJ SOLN
2.0000 mL | Freq: Once | INTRAMUSCULAR | Status: AC
  Administered 2016-05-17: 0.3 mL via INTRADERMAL
  Filled 2016-05-17: qty 2

## 2016-05-17 MED ORDER — LIDOCAINE 2% (20 MG/ML) 5 ML SYRINGE
INTRAMUSCULAR | Status: DC | PRN
  Administered 2016-05-17: 60 mg via INTRAVENOUS

## 2016-05-17 MED ORDER — PROPOFOL 10 MG/ML IV BOLUS
INTRAVENOUS | Status: DC | PRN
  Administered 2016-05-17: 40 mg via INTRAVENOUS
  Administered 2016-05-17: 60 mg via INTRAVENOUS

## 2016-05-17 NOTE — Transfer of Care (Signed)
Immediate Anesthesia Transfer of Care Note  Patient: Sonya Evans  Procedure(s) Performed: Procedure(s): COLONOSCOPY WITH PROPOFOL (N/A)  Patient Location: PACU  Anesthesia Type:General  Level of Consciousness: awake  Airway & Oxygen Therapy: Patient connected to nasal cannula oxygen  Post-op Assessment: Post -op Vital signs reviewed and stable  Post vital signs: stable  Last Vitals:  Vitals:   05/17/16 0840 05/17/16 0841  BP: 98/60 98/60  Pulse: 79 79  Resp: 15 10  Temp: (!) 35.9 C (!) 36.1 C    Last Pain:  Vitals:   05/17/16 0841  TempSrc: Tympanic  PainSc:          Complications: No apparent anesthesia complications

## 2016-05-17 NOTE — Anesthesia Preprocedure Evaluation (Signed)
Anesthesia Evaluation  Patient identified by MRN, date of birth, ID band Patient awake    Reviewed: Allergy & Precautions, NPO status   Airway Mallampati: II       Dental  (+) Teeth Intact   Pulmonary COPD, Current Smoker,     + decreased breath sounds      Cardiovascular Exercise Tolerance: Good + Peripheral Vascular Disease   Rhythm:Regular Rate:Normal     Neuro/Psych  Headaches, Anxiety Depression    GI/Hepatic Neg liver ROS, GERD  Medicated,  Endo/Other    Renal/GU negative Renal ROS     Musculoskeletal   Abdominal (+) + obese,   Peds negative pediatric ROS (+)  Hematology negative hematology ROS (+)   Anesthesia Other Findings   Reproductive/Obstetrics                             Anesthesia Physical Anesthesia Plan  ASA: III  Anesthesia Plan: General   Post-op Pain Management:    Induction: Intravenous  Airway Management Planned: Natural Airway and Nasal Cannula  Additional Equipment:   Intra-op Plan:   Post-operative Plan:   Informed Consent: I have reviewed the patients History and Physical, chart, labs and discussed the procedure including the risks, benefits and alternatives for the proposed anesthesia with the patient or authorized representative who has indicated his/her understanding and acceptance.     Plan Discussed with: CRNA  Anesthesia Plan Comments:         Anesthesia Quick Evaluation

## 2016-05-17 NOTE — Interval H&P Note (Signed)
History and Physical Interval Note:  05/17/2016 7:56 AM  Sonya Evans  has presented today for surgery, with the diagnosis of RECTAL BLEEDING  The various methods of treatment have been discussed with the patient and family. After consideration of risks, benefits and other options for treatment, the patient has consented to  Procedure(s): COLONOSCOPY WITH PROPOFOL (N/A) as a surgical intervention .  The patient's history has been reviewed, patient examined, no change in status, stable for surgery.  I have reviewed the patient's chart and labs.  Questions were answered to the patient's satisfaction.     SANKAR,SEEPLAPUTHUR G

## 2016-05-17 NOTE — Anesthesia Post-op Follow-up Note (Cosign Needed)
Anesthesia QCDR form completed.        

## 2016-05-17 NOTE — H&P (View-Only) (Signed)
Patient ID: Sonya Evans, female   DOB: [birth year], 51 y.o.   MRN: [id number]  Chief Complaint  Patient presents with  . Rectal Bleeding    HPI Sonya Evans is a 51 y.o. female here today for an evaluation rectal bleeding. Reports on Tuesday she was vomiting and had diarrhea. Patient additionally noticed some bright red blood. She states the blood was on the paper and in the bowl. States that she has been having diffuse lower abdominal pain. Reports that she is still having nausea and has no appetite. I have reviewed the history of present illness with the patient.  HPI   Past Medical History:  Diagnosis Date  . Acid reflux   . Anxiety   . Depression   . Headache   . Hot flashes   . Hyperlipidemia   . Migraine     Past Surgical History:  Procedure Laterality Date  . ABDOMINAL HYSTERECTOMY  2012  . COLONOSCOPY    . FOOT SURGERY      Family History  Problem Relation Age of Onset  . Hyperlipidemia Mother   . Neuropathy Mother   . Arthritis Father   . Hyperlipidemia Brother   . Cancer Maternal Grandmother     breast   . Alzheimer's disease Paternal Grandmother     Social History Social History  Substance Use Topics  . Smoking status: Current Every Day Smoker    Packs/day: 1.00    Types: Cigarettes  . Smokeless tobacco: Never Used  . Alcohol use No    Allergies  Allergen Reactions  . Desvenlafaxine Nausea And Vomiting  . Lurasidone Palpitations and Nausea And Vomiting    Current Outpatient Prescriptions  Medication Sig Dispense Refill  . atorvastatin (LIPITOR) 20 MG tablet Take 1 tablet (20 mg total) by mouth daily. 30 tablet 11  . meloxicam (MOBIC) 15 MG tablet Take 1 tablet (15 mg total) by mouth daily. 30 tablet 0  . mirtazapine (REMERON) 45 MG tablet Take 1 tablet (45 mg total) by mouth daily. 30 tablet 5  . omeprazole (PRILOSEC) 20 MG capsule Take 1 capsule (20 mg total) by mouth daily. 30 capsule 3  . ondansetron (ZOFRAN) 4 MG tablet Take 1  tablet (4 mg total) by mouth every 8 (eight) hours as needed for nausea or vomiting. 20 tablet 0  . promethazine (PHENERGAN) 25 MG tablet Take 1 tablet (25 mg total) by mouth every 6 (six) hours as needed for nausea or vomiting. 15 tablet 0  . QUEtiapine Fumarate (SEROQUEL XR) 150 MG 24 hr tablet Take 1 tablet (150 mg total) by mouth at bedtime. 30 tablet 0  . Vitamin D, Ergocalciferol, (DRISDOL) 50000 units CAPS capsule TAKE 1 CAPSULE BY MOUTH EVERY 7 DAYS 12 capsule 0  . polyethylene glycol powder (GLYCOLAX/MIRALAX) powder 255 grams one bottle for colonoscopy prep 255 g 0   No current facility-administered medications for this visit.     Review of Systems Review of Systems  Constitutional: Negative.   Respiratory: Negative.   Cardiovascular: Negative.     Blood pressure 132/76, pulse 72, temperature 98.7 F (37.1 C), temperature source Oral, resp. rate 12, height 5\' 6"  (1.676 m), weight 189 lb (85.7 kg).  Physical Exam Physical Exam  Constitutional: She is oriented to person, place, and time. She appears well-developed and well-nourished.  Eyes: Conjunctivae are normal. No scleral icterus.  Neck: Neck supple.  Cardiovascular: Normal rate, regular rhythm and normal heart sounds.   Pulmonary/Chest: Effort normal and breath sounds normal.  Abdominal: Soft. Bowel sounds are normal. There is no tenderness.  Lymphadenopathy:    She has no cervical adenopathy.  Neurological: She is alert and oriented to person, place, and time.  Skin: Skin is warm and dry.  Psychiatric: She has a normal mood and affect.    Data Reviewed Notes reviewed   Assessment    Rectal bleeding    Plan       Colonoscopy with possible biopsy/polypectomy prn: Information regarding the procedure, including its potential risks and complications (including but not limited to perforation of the bowel, which may require emergency surgery to repair, and bleeding) was verbally given to the patient. Educational  information regarding lower intestinal endoscopy was given to the patient. Written instructions for how to complete the bowel prep using Miralax were provided. The importance of drinking ample fluids to avoid dehydration as a result of the prep emphasized.  Patient has been scheduled for a colonoscopy on 05-17-16 at [facility].   This information has been scribed by Ples SpecterJessica Qualls CMA.   SANKAR,SEEPLAPUTHUR G 05/01/2016, 8:55 AM

## 2016-05-17 NOTE — Anesthesia Postprocedure Evaluation (Signed)
Anesthesia Post Note  Patient: Sonya Evans  Procedure(s) Performed: Procedure(s) (LRB): COLONOSCOPY WITH PROPOFOL (N/A)  Patient location during evaluation: PACU Anesthesia Type: General Level of consciousness: awake Pain management: pain level controlled Vital Signs Assessment: post-procedure vital signs reviewed and stable Respiratory status: spontaneous breathing Cardiovascular status: stable Anesthetic complications: no     Last Vitals:  Vitals:   05/17/16 0851 05/17/16 0901  BP: 102/62 103/75  Pulse: 70 71  Resp: 16 13  Temp:      Last Pain:  Vitals:   05/17/16 0841  TempSrc: Tympanic  PainSc:                  VAN STAVEREN,[name]

## 2016-05-17 NOTE — Op Note (Signed)
[facility] Gastroenterology Patient Name: Marchelle GearingWanda Bohan Procedure Date: 05/17/2016 8:02 AM MRN: [id number] Account #: [id number] Date of Birth: Jan 16, 1966 Admit Type: Outpatient Age: 51 [facility] ROOM 1 Gender: Female Note Status: Finalized Procedure:            Colonoscopy Indications:          Rectal bleeding Providers:            Seeplaputhur G. Evette CristalSankar, MD Referring MD:         Filbert BertholdAmy L. Krebs (Referring MD) Medicines:            General Anesthesia Complications:        No immediate complications. Procedure:            Pre-Anesthesia Assessment:                       - General anesthesia under the supervision of an                        anesthesiologist was determined to be medically                        necessary for this procedure based on review of the                        patient's medical history, medications, and prior                        anesthesia history.                       After obtaining informed consent, the colonoscope was                        passed under direct vision. Throughout the procedure,                        the patient's blood pressure, pulse, and oxygen                        saturations were monitored continuously. The                        Colonoscope was introduced through the anus and                        advanced to the the cecum, identified by the ileocecal                        valve. The colonoscopy was performed without                        difficulty. The patient tolerated the procedure well.                        The quality of the bowel preparation was good. Findings:      The perianal and digital rectal examinations were normal.      A 3 mm polyp was found in the rectum (benign-appearing lesion). The       polyp was sessile. The polyp was removed with a cold biopsy forceps.  Resection and retrieval were complete.      The exam was otherwise without abnormality on direct and retroflexion      views. Impression:           - One benign appearing 3 mm polyp in the rectum,                        removed with a cold biopsy forceps. Resected and                        retrieved.                       - The examination was otherwise normal on direct and                        retroflexion views. Recommendation:       - Discharge patient to home.                       - Resume previous diet.                       - Continue present medications.                       - Return to my office PRN. Procedure Code(s):    --- Professional ---                       [id number], Colonoscopy, flexible; with biopsy, single or                        multiple Diagnosis Code(s):    --- Professional ---                       K62.1, Rectal polyp                       K62.5, Hemorrhage of anus and rectum CPT copyright 2016 American Medical Association. All rights reserved. The codes documented in this report are preliminary and upon coder review may  be revised to meet current compliance requirements. Kieth Brightly, MD 05/17/2016 8:41:58 AM This report has been signed electronically. Number of Addenda: 0 Note Initiated On: 05/17/2016 8:02 AM Scope Withdrawal Time: 0 hours 11 minutes 11 seconds  Total Procedure Duration: 0 hours 28 minutes 8 seconds       [facility]

## 2016-05-18 ENCOUNTER — Encounter: Payer: Self-pay | Admitting: General Surgery

## 2016-05-19 LAB — SURGICAL PATHOLOGY

## 2016-06-06 ENCOUNTER — Other Ambulatory Visit: Payer: Self-pay | Admitting: Family Medicine

## 2016-06-06 DIAGNOSIS — K219 Gastro-esophageal reflux disease without esophagitis: Secondary | ICD-10-CM

## 2016-07-27 DIAGNOSIS — M65311 Trigger thumb, right thumb: Secondary | ICD-10-CM | POA: Diagnosis not present

## 2016-08-07 ENCOUNTER — Other Ambulatory Visit: Payer: Self-pay

## 2016-08-07 DIAGNOSIS — E785 Hyperlipidemia, unspecified: Secondary | ICD-10-CM

## 2016-08-07 MED ORDER — ATORVASTATIN CALCIUM 20 MG PO TABS: 20.0000 mg | ORAL_TABLET | Freq: Every day | ORAL | 0 refills | Status: DC

## 2016-08-07 NOTE — Telephone Encounter (Signed)
Pharmacy requesting refill Last ov 09/16/15 Last filled 08/12/15

## 2016-09-06 ENCOUNTER — Other Ambulatory Visit: Payer: Self-pay | Admitting: Family Medicine

## 2016-09-20 ENCOUNTER — Other Ambulatory Visit: Payer: Self-pay | Admitting: Family Medicine

## 2016-09-20 DIAGNOSIS — E785 Hyperlipidemia, unspecified: Secondary | ICD-10-CM

## 2016-09-20 DIAGNOSIS — K219 Gastro-esophageal reflux disease without esophagitis: Secondary | ICD-10-CM

## 2016-09-20 NOTE — Telephone Encounter (Signed)
Pt has not been seen by us for 1 year.  Needs appointment for problem-focused visit and annual physical.

## 2016-09-21 MED ORDER — ATORVASTATIN CALCIUM 20 MG PO TABS: 20.0000 mg | ORAL_TABLET | Freq: Every day | ORAL | 0 refills | Status: DC

## 2016-09-21 NOTE — Telephone Encounter (Signed)
Spoke to patient doesn't have any money for appointment but still wants med's refill for atorvastatin and suggested to be seen by provider once a year is necessary  to Rx any refill and any chronic health issue patient needs to be seen every 3-6 months before they can Rx any cholesterol med's and if she doesn't have any money then option is [facility].

## 2016-09-25 ENCOUNTER — Other Ambulatory Visit: Payer: Self-pay | Admitting: Family Medicine

## 2016-09-26 ENCOUNTER — Telehealth: Payer: Self-pay

## 2016-09-26 ENCOUNTER — Other Ambulatory Visit: Payer: Self-pay

## 2016-09-26 DIAGNOSIS — F329 Major depressive disorder, single episode, unspecified: Secondary | ICD-10-CM

## 2016-09-26 DIAGNOSIS — F32A Depression, unspecified: Secondary | ICD-10-CM

## 2016-09-26 MED ORDER — MIRTAZAPINE 45 MG PO TABS: 45.0000 mg | ORAL_TABLET | Freq: Every day | ORAL | 0 refills | Status: DC

## 2016-09-26 NOTE — Addendum Note (Signed)
Addended by: Smitty CordsKARAMALEGOS, ALEXANDER J on: 09/26/2016 01:42 PM   Modules accepted: Orders

## 2016-09-26 NOTE — Telephone Encounter (Signed)
See prior notes on chart regarding refill requests. I have sent her previously a 6 month supply in 04/2016.  I understand her circumstances, and will agree with another 30 day 1 month supply Mirtazapine 45mg . Sent to [facility].  Follow-up as anticipated within 1 month  Saralyn PilarAlexander Aishi Courts, DO [facility] 09/26/2016, 1:41 PM

## 2016-09-26 NOTE — Telephone Encounter (Signed)
Patient called requesting a refill of miitrazapine 45mg .  She has already spoke to someone before and she is asking for 1 refill and she hopes she gets a job next month.  Please advise.

## 2016-09-26 NOTE — Telephone Encounter (Signed)
Patient called requesting a refill of miitrazapine 45mg .  She has already spoke to someone before and she is asking for 1 refill and she hopes she gets a job next month. It's been a year since she was seen in the office.  Please advise.

## 2016-10-02 ENCOUNTER — Other Ambulatory Visit: Payer: Self-pay | Admitting: Family Medicine

## 2016-10-02 DIAGNOSIS — F329 Major depressive disorder, single episode, unspecified: Secondary | ICD-10-CM

## 2016-10-02 DIAGNOSIS — R232 Flushing: Secondary | ICD-10-CM

## 2016-10-02 DIAGNOSIS — F32A Depression, unspecified: Secondary | ICD-10-CM

## 2016-10-03 ENCOUNTER — Telehealth: Payer: Self-pay | Admitting: Family Medicine

## 2016-10-03 NOTE — Telephone Encounter (Signed)
Pt said she only has 3 days worth of mirtazapine left and [facility].  She asked if she could get a bridge prescription to last until that appt.  Her call back number is [phone]

## 2016-10-03 NOTE — Telephone Encounter (Signed)
I have already refilled her Mirtzapine to [facility], yesterday 10/02/16, gave her 1 month supply  Sonya PilarAlexander Asta Corbridge, DO [facility] 10/03/2016, 11:34 AM

## 2016-10-03 NOTE — Telephone Encounter (Signed)
Pt advised.

## 2017-10-10 DIAGNOSIS — R42 Dizziness and giddiness: Secondary | ICD-10-CM | POA: Diagnosis not present

## 2017-10-10 DIAGNOSIS — R319 Hematuria, unspecified: Secondary | ICD-10-CM | POA: Diagnosis not present

## 2017-10-10 DIAGNOSIS — R05 Cough: Secondary | ICD-10-CM | POA: Diagnosis not present

## 2017-10-10 DIAGNOSIS — R0600 Dyspnea, unspecified: Secondary | ICD-10-CM | POA: Diagnosis not present

## 2017-10-10 DIAGNOSIS — R1032 Left lower quadrant pain: Secondary | ICD-10-CM | POA: Diagnosis not present

## 2017-10-10 DIAGNOSIS — R079 Chest pain, unspecified: Secondary | ICD-10-CM | POA: Diagnosis not present

## 2017-11-14 ENCOUNTER — Other Ambulatory Visit: Payer: Self-pay

## 2017-11-14 ENCOUNTER — Other Ambulatory Visit: Payer: Self-pay | Admitting: Nurse Practitioner

## 2017-11-14 ENCOUNTER — Ambulatory Visit (INDEPENDENT_AMBULATORY_CARE_PROVIDER_SITE_OTHER): Payer: BLUE CROSS/BLUE SHIELD | Admitting: Nurse Practitioner

## 2017-11-14 VITALS — BP 121/74 | HR 93 | Temp 98.5°F | Ht 66.0 in | Wt 194.2 lb

## 2017-11-14 DIAGNOSIS — E785 Hyperlipidemia, unspecified: Secondary | ICD-10-CM | POA: Diagnosis not present

## 2017-11-14 DIAGNOSIS — B354 Tinea corporis: Secondary | ICD-10-CM

## 2017-11-14 DIAGNOSIS — B350 Tinea barbae and tinea capitis: Secondary | ICD-10-CM

## 2017-11-14 DIAGNOSIS — R252 Cramp and spasm: Secondary | ICD-10-CM

## 2017-11-14 DIAGNOSIS — R631 Polydipsia: Secondary | ICD-10-CM

## 2017-11-14 DIAGNOSIS — H538 Other visual disturbances: Secondary | ICD-10-CM

## 2017-11-14 DIAGNOSIS — K582 Mixed irritable bowel syndrome: Secondary | ICD-10-CM | POA: Insufficient documentation

## 2017-11-14 DIAGNOSIS — K219 Gastro-esophageal reflux disease without esophagitis: Secondary | ICD-10-CM | POA: Diagnosis not present

## 2017-11-14 MED ORDER — OMEPRAZOLE 40 MG PO CPDR: 40.0000 mg | DELAYED_RELEASE_CAPSULE | Freq: Every day | ORAL | 1 refills | Status: DC

## 2017-11-14 MED ORDER — KETOCONAZOLE 2 % EX SHAM: 1.0000 "application " | MEDICATED_SHAMPOO | CUTANEOUS | 0 refills | Status: DC

## 2017-11-14 MED ORDER — ATORVASTATIN CALCIUM 20 MG PO TABS: 20.0000 mg | ORAL_TABLET | Freq: Every day | ORAL | 1 refills | Status: DC

## 2017-11-14 MED ORDER — CLOTRIMAZOLE-BETAMETHASONE 1-0.05 % EX CREA: 1.0000 "application " | TOPICAL_CREAM | Freq: Two times a day (BID) | CUTANEOUS | 0 refills | Status: DC

## 2017-11-14 NOTE — Progress Notes (Signed)
Subjective:    Patient ID: Sonya Evans, female    DOB: 1965-05-15, 52 y.o.   MRN: [id number]  Sonya Evans is a 52 y.o. female presenting on 11/14/2017 for Medication Refill (need cholesterol recheck and want start back on cholesterol medication. Pt also complaining of been thrist all the time, left foot cramps and dry skin.); Anxiety (managing anxiety medication); Gastroesophageal Reflux (Intermittent upper left abdominal pain); and Rash (Head, behind ears, left 3rd finger, and Right anterior upper thigh)   HPI Re-Establish Care - Last seen by Bjorn Pippin, NP at this clinic 09/2015 Patient returns today to resume care as she now has insurance again.  Patient left clinic because she lost insurance and continued only mental health care at [facility]  She has continued gabapentin, seroquel, and mirtazapine for her anxiety, depression, and insomnia.  She was also recently seen at [facility] acute care.  She had acute illness with chest pain and was diagnosed with bronchitis.  Breathing treatment, prednisone, abx helped symptoms.  She is no longer having trouble breathing.  Records are reviewed in Care Everywhere today.  No current exercise after work.  Swing shifts 1-2 shifts.  1-10pm Thursday 12-9p Friday, every other Sat am 8a-5p  Reflux Omeprazole by rx was helping, but continued having heartburn.  Initially only at night with lying down.  Then began having some symptoms during day.  Over last 2 weeks has been throughout the day and at night.  Blurry vision Very blurry vision and is transient, polydipsia. This comes and goes throughout the day. Having trouble with concentration.  Duration again is unknown, but is longer than 3 weeks.  Foot cramping   Has 5th toe on left foot that cramps during bath and at night.  Duration unknown, but longer than 3 weeks.  Patient is taking potassium pill and has had relief.   Flaky Rash - scalp and back of head, over ears.  Scratches and has flakes.  Is  postauricular.  Has seen dermatology about 5 years ago and was told she had psoriasis.  Patient cannot recall if she has ever used any antifungal creams.  Past Medical History:  Diagnosis Date  . Acid reflux   . Anxiety   . Depression   . Headache   . Hot flashes   . Hyperlipidemia   . Migraine    Past Surgical History:  Procedure Laterality Date  . ABDOMINAL HYSTERECTOMY  2012  . COLONOSCOPY    . COLONOSCOPY WITH PROPOFOL N/A 05/17/2016   Procedure: COLONOSCOPY WITH PROPOFOL;  Surgeon: Kieth Brightly, MD;  Location: [facility] ENDOSCOPY;  Service: Endoscopy;  Laterality: N/A;  . FOOT SURGERY     Social History   Socioeconomic History  . Marital status: Divorced    Spouse name: Not on file  . Number of children: Not on file  . Years of education: Not on file  . Highest education level: Not on file  Occupational History  . Not on file  Social Needs  . Financial resource strain: Not on file  . Food insecurity:    Worry: Not on file    Inability: Not on file  . Transportation needs:    Medical: Not on file    Non-medical: Not on file  Tobacco Use  . Smoking status: Current Every Day Smoker    Packs/day: 1.00    Types: Cigarettes  . Smokeless tobacco: Never Used  Substance and Sexual Activity  . Alcohol use: No  . Drug use:  No  . Sexual activity: Not on file  Lifestyle  . Physical activity:    Days per week: Not on file    Minutes per session: Not on file  . Stress: Not on file  Relationships  . Social connections:    Talks on phone: Not on file    Gets together: Not on file    Attends religious service: Not on file    Active member of club or organization: Not on file    Attends meetings of clubs or organizations: Not on file    Relationship status: Not on file  . Intimate partner violence:    Fear of current or ex partner: Not on file    Emotionally abused: Not on file    Physically abused: Not on file    Forced sexual activity: Not on file  Other Topics  Concern  . Not on file  Social History Narrative  . Not on file   Family History  Problem Relation Age of Onset  . Hyperlipidemia Mother   . Neuropathy Mother   . Arthritis Father   . Hyperlipidemia Brother   . Cancer Maternal Grandmother        breast   . Alzheimer's disease Paternal Grandmother    Current Outpatient Medications on File Prior to Visit  Medication Sig  . gabapentin (NEURONTIN) 100 MG capsule TAKE 3 CAPSULES BY MOUTH AT BEDTIME  . mirtazapine (REMERON) 45 MG tablet TAKE 1 TABLET BY MOUTH ONCE DAILY  . QUEtiapine Fumarate (SEROQUEL XR) 150 MG 24 hr tablet Take 1 tablet (150 mg total) by mouth at bedtime.  Marland Kitchen PROAIR HFA 108 (90 Base) MCG/ACT inhaler    No current facility-administered medications on file prior to visit.     Review of Systems  Constitutional: Negative for chills and fever.  HENT: Negative for congestion and sore throat.   Eyes: Positive for visual disturbance. Negative for pain.  Respiratory: Negative for cough, shortness of breath and wheezing.   Cardiovascular: Positive for palpitations. Negative for chest pain and leg swelling.  Gastrointestinal: Positive for abdominal pain, constipation and diarrhea. Negative for blood in stool, nausea and vomiting.  Endocrine: Positive for polydipsia.  Genitourinary: Negative for dysuria, frequency, hematuria and urgency.  Musculoskeletal: Positive for myalgias (left toe cramps). Negative for back pain and neck pain.  Skin: Positive for rash.  Allergic/Immunologic: Negative for environmental allergies.  Neurological: Negative for dizziness, weakness and headaches.  Hematological: Does not bruise/bleed easily.  Psychiatric/Behavioral: Positive for dysphoric mood and sleep disturbance. Negative for self-injury and suicidal ideas. The patient is nervous/anxious.    Per HPI unless specifically indicated above     Objective:    BP 121/74 (BP Location: Right Arm, Patient Position: Sitting, Cuff Size: Normal)    Pulse 93   Temp 98.5 F (36.9 C) (Oral)   Ht 5\' 6"  (1.676 m)   Wt 194 lb 3.2 oz (88.1 kg)   BMI 31.34 kg/m   Wt Readings from Last 3 Encounters:  11/14/17 194 lb 3.2 oz (88.1 kg)  05/17/16 189 lb (85.7 kg)  04/27/16 189 lb (85.7 kg)    Physical Exam  Constitutional: She is oriented to person, place, and time. She appears well-developed and well-nourished. No distress.  Central adiposity  HENT:  Head: Normocephalic and atraumatic.  Cardiovascular: Normal rate, regular rhythm, S1 normal, S2 normal, normal heart sounds and intact distal pulses.  Pulmonary/Chest: Effort normal and breath sounds normal. No respiratory distress.  Abdominal: Soft. She exhibits no distension.  Bowel sounds are increased. There is no hepatosplenomegaly. There is tenderness in the right lower quadrant, epigastric area and left lower quadrant. There is no rigidity, no rebound, no guarding, no CVA tenderness, no tenderness at McBurney's point and negative Murphy's sign. No hernia.  Neurological: She is alert and oriented to person, place, and time.  Skin: Skin is warm and dry. Capillary refill takes less than 2 seconds. Rash (pruritic rash with erythematous papules and white flaky scales is located in scalp, post-auricular bilateraly, in pinnae of ears, on left 3rd finger, and R anterior thigh) noted.  Psychiatric: Judgment and thought content normal. Her mood appears anxious. Her speech is rapid and/or pressured. She is hyperactive. Cognition and memory are impaired (memory recall decreased). She expresses no suicidal ideation. She expresses no suicidal plans.  Vitals reviewed.      Assessment & Plan:   Problem List Items Addressed This Visit      Digestive   GERD (gastroesophageal reflux disease) - Primary Currently poorly controlled on omeprazole 20 mg once daily.  Plan: 1. INCREASE omeprazole to 40mg  once daily. Side effects discussed. Pt wants to continue med. 2. Avoid diet triggers. Reviewed need to  seek care if globus sensation, difficulty swallowing, s/sx of GI bleed. 3. Follow up as needed and in 3 months.     Relevant Medications   omeprazole (PRILOSEC) 40 MG capsule     Other   HLD (hyperlipidemia) Status unknown.  Recheck labs.  Continue atorvastatin 20 mg once daily without changes today.  Refills provided. Followup 3-6 months and after labs.    Relevant Medications   atorvastatin (LIPITOR) 20 MG tablet   Other Relevant Orders   Lipid panel (Completed)   TSH (Completed)    Other Visit Diagnoses    Tinea capitis       Tinea corporis     Patient with flaky rash consistent with tinea capitis and tinea corporis.  Cannot exclude psoriasis.  Plan: 1. START lotrisone cream around ears, finger, thigh. 2. START ketoconazole shampoo twice weekly for 2 weeks or until bottle is empty. 3. Consider dermatology referral in future. 4. FOLLOW-UP prn.    Foot cramps     Unknown cause, but is likely self limited.  Will check electrolytes for possible cause. Follow-up after labs   Relevant Orders   COMPLETE METABOLIC PANEL WITH GFR (Completed)   Magnesium (Completed)   Blurry vision     Unknown cause of blurry vision, but with polydipsia need to screen for diabetes.  Plan: 1. Labs today 2. Encouraged patient to consider optometry visit.   3. Follow-up after labs and in 3-6 months.   Relevant Orders   COMPLETE METABOLIC PANEL WITH GFR (Completed)   Hemoglobin A1c (Completed)   Polydipsia     See Blurry vision above.   Relevant Orders   COMPLETE METABOLIC PANEL WITH GFR (Completed)   Hemoglobin A1c (Completed)      Meds ordered this encounter  Medications  . atorvastatin (LIPITOR) 20 MG tablet    Sig: Take 1 tablet (20 mg total) by mouth daily.    Dispense:  90 tablet    Refill:  1    Order Specific Question:   Supervising Provider    Answer:   Smitty CordsKARAMALEGOS, ALEXANDER J [[id number]]  . clotrimazole-betamethasone (LOTRISONE) cream    Sig: Apply 1 application topically 2 (two)  times daily.    Dispense:  30 g    Refill:  0    Order Specific Question:  Supervising Provider    Answer:   Smitty CordsKARAMALEGOS, ALEXANDER J [[id number]]  . ketoconazole (NIZORAL) 2 % shampoo    Sig: Apply 1 application topically 2 (two) times a week.    Dispense:  120 mL    Refill:  0    Order Specific Question:   Supervising Provider    Answer:   Smitty CordsKARAMALEGOS, ALEXANDER J [[id number]]  . omeprazole (PRILOSEC) 40 MG capsule    Sig: Take 1 capsule (40 mg total) by mouth daily.    Dispense:  90 capsule    Refill:  1    Order Specific Question:   Supervising Provider    Answer:   Smitty CordsKARAMALEGOS, ALEXANDER J [[id number]]     Follow up plan: Return in about 4 weeks (around 12/12/2017) for mental health.  A total of 45 minutes was spent face-to-face with this patient. Greater than 50% of this time (30/45 minutes) was spent in counseling and coordination of care with the patient regarding rash treatment/prevention/causes, abdominal pain treatment/ dietary approaches for symptom management/medication side effects of PPI/effects of chronically uncontrolled GERD.   Sonya McardleLauren Atiya Yera, DNP, AGPCNP-BC Adult Gerontology Primary Care Nurse Practitioner [facility] 11/14/2017, 10:42 AM

## 2017-11-14 NOTE — Patient Instructions (Addendum)
Sonya Evans,   Thank you for coming in to clinic today.  1. INCREASE omeprazole to 40 mg once daily.  2. Continue gabapentin, seroquel, and mirtazapine at bedtime.  3. START Lotrisone cream over and on your ears, left third finger and R upper thigh  4. START ketoconazole shampoo twice weekly for 2 weeks or until your bottle is gone.  5. If still having flakiness of scalp and ears, it is most likely psoriasis and we can discuss dermatology visit for treatment.  Please schedule a follow-up appointment with Wilhelmina McardleLauren Andric Kerce, AGNP. Return in about 4 weeks (around 12/12/2017) for mental health.  If you have any other questions or concerns, please feel free to call the clinic or send a message through MyChart. You may also schedule an earlier appointment if necessary.  You will receive a survey after today's visit either digitally by e-mail or paper by [employer] mail. Your experiences and feedback matter to us.  Please respond so we know how we are doing as we provide care for you.   Wilhelmina McardleLauren Mylo Driskill, DNP, AGNP-BC Adult Gerontology Nurse Practitioner [facility]

## 2017-11-15 ENCOUNTER — Encounter: Payer: Self-pay | Admitting: Nurse Practitioner

## 2017-11-15 LAB — COMPLETE METABOLIC PANEL WITH GFR
AG Ratio: 1.4 (calc) (ref 1.0–2.5)
ALT: 20 U/L (ref 6–29)
AST: 22 U/L (ref 10–35)
Albumin: 4.6 g/dL (ref 3.6–5.1)
Alkaline phosphatase (APISO): 114 U/L (ref 33–115)
BUN: 7 mg/dL (ref 7–25)
CO2: 27 mmol/L (ref 20–32)
Calcium: 10.3 mg/dL — ABNORMAL HIGH (ref 8.6–10.2)
Chloride: 104 mmol/L (ref 98–110)
Creat: 1.01 mg/dL (ref 0.50–1.10)
GFR, Est African American: 76 mL/min/{1.73_m2} (ref >=60)
GFR, Est Non African American: 65 mL/min/{1.73_m2} (ref >=60)
Globulin: 3.2 g/dL (calc) (ref 1.9–3.7)
Glucose, Bld: 116 mg/dL — ABNORMAL HIGH (ref 65–99)
Potassium: 5.4 mmol/L — ABNORMAL HIGH (ref 3.5–5.3)
Sodium: 140 mmol/L (ref 135–146)
Total Bilirubin: 0.5 mg/dL (ref 0.2–1.2)
Total Protein: 7.8 g/dL (ref 6.1–8.1)

## 2017-11-15 LAB — LIPID PANEL
Cholesterol: 337 mg/dL — ABNORMAL HIGH (ref <200)
HDL: 39 mg/dL — ABNORMAL LOW (ref >50)
Non-HDL Cholesterol (Calc): 298 mg/dL (calc) — ABNORMAL HIGH (ref <130)
Total CHOL/HDL Ratio: 8.6 (calc) — ABNORMAL HIGH (ref <5.0)
Triglycerides: 405 mg/dL — ABNORMAL HIGH (ref <150)

## 2017-11-15 LAB — HEMOGLOBIN A1C
Hgb A1c MFr Bld: 6.1 % of total Hgb — ABNORMAL HIGH (ref <5.7)
Mean Plasma Glucose: 128 (calc)
eAG (mmol/L): 7.1 (calc)

## 2017-11-15 LAB — TSH: TSH: 2.52 mIU/L

## 2017-11-15 LAB — MAGNESIUM: Magnesium: 2.2 mg/dL (ref 1.5–2.5)

## 2017-12-12 ENCOUNTER — Encounter: Payer: Self-pay | Admitting: Nurse Practitioner

## 2017-12-12 ENCOUNTER — Other Ambulatory Visit: Payer: Self-pay

## 2017-12-12 ENCOUNTER — Ambulatory Visit (INDEPENDENT_AMBULATORY_CARE_PROVIDER_SITE_OTHER): Payer: BLUE CROSS/BLUE SHIELD | Admitting: Nurse Practitioner

## 2017-12-12 VITALS — BP 116/69 | HR 99 | Temp 98.3°F | Resp 18 | Ht 66.0 in | Wt 190.2 lb

## 2017-12-12 DIAGNOSIS — F32A Depression, unspecified: Secondary | ICD-10-CM

## 2017-12-12 DIAGNOSIS — F329 Major depressive disorder, single episode, unspecified: Secondary | ICD-10-CM | POA: Diagnosis not present

## 2017-12-12 DIAGNOSIS — J209 Acute bronchitis, unspecified: Secondary | ICD-10-CM

## 2017-12-12 DIAGNOSIS — F419 Anxiety disorder, unspecified: Secondary | ICD-10-CM

## 2017-12-12 DIAGNOSIS — E875 Hyperkalemia: Secondary | ICD-10-CM

## 2017-12-12 DIAGNOSIS — J42 Unspecified chronic bronchitis: Secondary | ICD-10-CM

## 2017-12-12 LAB — COMPLETE METABOLIC PANEL WITH GFR
AG Ratio: 1.5 (calc) (ref 1.0–2.5)
ALT: 18 U/L (ref 6–29)
AST: 22 U/L (ref 10–35)
Albumin: 4.2 g/dL (ref 3.6–5.1)
Alkaline phosphatase (APISO): 97 U/L (ref 33–115)
BUN/Creatinine Ratio: 5 (calc) — ABNORMAL LOW (ref 6–22)
BUN: 5 mg/dL — ABNORMAL LOW (ref 7–25)
CO2: 28 mmol/L (ref 20–32)
Calcium: 9.7 mg/dL (ref 8.6–10.2)
Chloride: 105 mmol/L (ref 98–110)
Creat: 1.02 mg/dL (ref 0.50–1.10)
GFR, Est African American: 75 mL/min/{1.73_m2} (ref >=60)
GFR, Est Non African American: 65 mL/min/{1.73_m2} (ref >=60)
Globulin: 2.8 g/dL (calc) (ref 1.9–3.7)
Glucose, Bld: 111 mg/dL — ABNORMAL HIGH (ref 65–99)
Potassium: 5 mmol/L (ref 3.5–5.3)
Sodium: 144 mmol/L (ref 135–146)
Total Bilirubin: 0.4 mg/dL (ref 0.2–1.2)
Total Protein: 7 g/dL (ref 6.1–8.1)

## 2017-12-12 MED ORDER — TIOTROPIUM BROMIDE MONOHYDRATE 1.25 MCG/ACT IN AERS: 2.0000 | INHALATION_SPRAY | Freq: Every day | RESPIRATORY_TRACT | 5 refills | Status: DC

## 2017-12-12 MED ORDER — QUETIAPINE FUMARATE ER 150 MG PO TB24: 150.0000 mg | ORAL_TABLET | Freq: Every day | ORAL | 5 refills | Status: DC

## 2017-12-12 MED ORDER — GABAPENTIN 300 MG PO CAPS: 300.0000 mg | ORAL_CAPSULE | Freq: Every day | ORAL | 5 refills | Status: DC

## 2017-12-12 MED ORDER — MIRTAZAPINE 45 MG PO TABS: 45.0000 mg | ORAL_TABLET | Freq: Every day | ORAL | 5 refills | Status: DC

## 2017-12-12 MED ORDER — PREDNISONE 50 MG PO TABS: 50.0000 mg | ORAL_TABLET | Freq: Every day | ORAL | 0 refills | Status: AC

## 2017-12-12 NOTE — Assessment & Plan Note (Signed)
See AP depression 

## 2017-12-12 NOTE — Progress Notes (Signed)
Subjective:    Patient ID: Sonya Evans, female    DOB: April 22, 1965, 52 y.o.   MRN: [id number]  Sonya Evans is a 52 y.o. female presenting on 12/12/2017 for Anxiety and Depression and Cough (nasal congestion, chest congestion x 2 weeks )   HPI Anxiety and Depression Patient is taking Seroquel XR, mirtazapine 45 mg, and gabapentin 300 mg at bedtime.    Has been on these medications since her son died in 05/02/2007.   When she first started, she was on Klonopin, Celexa.  Changed related to problems with sleep, so added Seroquel.  Started seeing Dr. Marguerite Olea at [facility].  Was placed on different medications that were more cost effected.   - Stopped all medications at one time, had withdrawal.   - Continued having crying spells, insomnia. - Got back on medications 01/29/16and has been on stable doses since then. - Currently feels her medications are working well.  Only symptom is racing heart at night when lying down that prevents sleep onset.  Is able to fall asleep after about 2-3 hours. - Feels tired when going to bed, but has awakening moments when she lies down. - Sleeps about 6-7 hours most nights.  There are some nights only sleeping 3 hours which occurs about 2-3 times per month to 1 x per week.  Cough Has had nasal congestion for 2 weeks with cough (productive yellow/green).  Symptoms are very similar to her presentation in July 2019, when she was seen at [facility] and was diagnosed with an asthma exacerbation.  Symptoms had improved until 2 weeks ago. - Patient is continuing to smoke 1 ppd. - Has lots of environmental allergens of mold/dusts in her home.  Weather changes and humidity has impacted sinuses.   Hypercalcemia Is eating high dairy, no meat.  Patient asks about significance of last labs.  GAD 7 : Generalized Anxiety Score 12/12/2017 08/09/2015  Nervous, Anxious, on Edge 1 2  Control/stop worrying 1 2  Worry too much - different things 1 2  Trouble  relaxing 0 3  Restless 0 0  Easily annoyed or irritable 0 0  Afraid - awful might happen 0 0  Total GAD 7 Score 3 9  Anxiety Difficulty Not difficult at all Not difficult at all   Depression screen [facility] 2/9 12/12/2017  Decreased Interest 0  Down, Depressed, Hopeless 0  PHQ - 2 Score 0  Altered sleeping 2  Tired, decreased energy 3  Change in appetite 0  Feeling bad or failure about yourself  0  Trouble concentrating 1  Moving slowly or fidgety/restless 0  Suicidal thoughts 0  PHQ-9 Score 6   Social History   Tobacco Use  . Smoking status: Current Every Day Smoker    Packs/day: 1.00    Types: Cigarettes  . Smokeless tobacco: Never Used  Substance Use Topics  . Alcohol use: No  . Drug use: No    Review of Systems Per HPI unless specifically indicated above     Objective:    BP 116/69 (BP Location: Left Arm, Patient Position: Sitting, Cuff Size: Normal)   Pulse 99   Temp 98.3 F (36.8 C) (Oral)   Resp 18   Ht 5\' 6"  (1.676 m)   Wt 190 lb 3.2 oz (86.3 kg)   SpO2 96%   BMI 30.70 kg/m   Wt Readings from Last 3 Encounters:  12/12/17 190 lb 3.2 oz (86.3 kg)  11/14/17 194 lb 3.2 oz (  88.1 kg)  05/17/16 189 lb (85.7 kg)    Physical Exam  Constitutional: She is oriented to person, place, and time. She appears well-developed and well-nourished. No distress.  HENT:  Head: Normocephalic and atraumatic.  Right Ear: Hearing, tympanic membrane, external ear and ear canal normal.  Left Ear: Hearing, tympanic membrane, external ear and ear canal normal.  Nose: Nose normal. Right sinus exhibits no maxillary sinus tenderness and no frontal sinus tenderness. Left sinus exhibits no maxillary sinus tenderness and no frontal sinus tenderness.  Mouth/Throat: Uvula is midline, oropharynx is clear and moist and mucous membranes are normal. Tonsils are 0 on the right. Tonsils are 0 on the left.  Eyes: Pupils are equal, round, and reactive to light. Conjunctivae, EOM and lids are normal.    Neck: Normal range of motion. Neck supple. Carotid bruit is not present.  Cardiovascular: Normal rate, regular rhythm, S1 normal, S2 normal and normal heart sounds.  Pulmonary/Chest: No accessory muscle usage. No respiratory distress. She has wheezes (inspiratory and expiratory wheezes throughout lungs). She has rhonchi (throughout lungs).  Abdominal: Soft. Normal appearance and bowel sounds are normal. There is no tenderness.  Neurological: She is alert and oriented to person, place, and time.  Skin: Skin is warm and dry. Capillary refill takes less than 2 seconds.  Psychiatric: Judgment and thought content normal. Her mood appears anxious. Her speech is rapid and/or pressured and tangential. She is agitated. Cognition and memory are normal.  Regularly pushes boundaries set at beginning of visit for problems that can be addressed.  Reset agenda 3 times, continued to keep same 2 problems (anxiety and cough).  Vitals reviewed.  Results for orders placed or performed in visit on 11/14/17  COMPLETE METABOLIC PANEL WITH GFR  Result Value Ref Range   Glucose, Bld 116 (H) 65 - 99 mg/dL   BUN 7 7 - 25 mg/dL   Creat 5.64 3.32 - 9.51 mg/dL   GFR, Est Non African American 65 > OR = 60 mL/min/1.65m2   GFR, Est African American 76 > OR = 60 mL/min/1.23m2   BUN/Creatinine Ratio NOT APPLICABLE 6 - 22 (calc)   Sodium 140 135 - 146 mmol/L   Potassium 5.4 (H) 3.5 - 5.3 mmol/L   Chloride 104 98 - 110 mmol/L   CO2 27 20 - 32 mmol/L   Calcium 10.3 (H) 8.6 - 10.2 mg/dL   Total Protein 7.8 6.1 - 8.1 g/dL   Albumin 4.6 3.6 - 5.1 g/dL   Globulin 3.2 1.9 - 3.7 g/dL (calc)   AG Ratio 1.4 1.0 - 2.5 (calc)   Total Bilirubin 0.5 0.2 - 1.2 mg/dL   Alkaline phosphatase (APISO) 114 33 - 115 U/L   AST 22 10 - 35 U/L   ALT 20 6 - 29 U/L  Lipid panel  Result Value Ref Range   Cholesterol 337 (H) <200 mg/dL   HDL 39 (L) >88 mg/dL   Triglycerides 416 (H) <150 mg/dL   LDL Cholesterol (Calc)  mg/dL (calc)   Total  CHOL/HDL Ratio 8.6 (H) <5.0 (calc)   Non-HDL Cholesterol (Calc) 298 (H) <130 mg/dL (calc)  TSH  Result Value Ref Range   TSH 2.52 mIU/L  Hemoglobin A1c  Result Value Ref Range   Hgb A1c MFr Bld 6.1 (H) <5.7 % of total Hgb   Mean Plasma Glucose 128 (calc)   eAG (mmol/L) 7.1 (calc)  Magnesium  Result Value Ref Range   Magnesium 2.2 1.5 - 2.5 mg/dL  Assessment & Plan:   Problem List Items Addressed This Visit      Other   Anxiety    See AP depression      Relevant Medications   mirtazapine (REMERON) 45 MG tablet   Clinical depression    Patient is no longer following with Psychiatry.  Reports no missed doses since stopping that relationship with Dr. Marguerite Olea at [facility].  Patient's anxiety and depression seem stable.  Patient reports no changes in medication doses over the last 1 year.  Plan: 1. Continue seroquel, mirtazapine, gabapentin without changes. 2. Last lipids elevated, could be from seroquel.  Would prefer psychiatry management of anxiety and depression as patient is on medications that require more close monitoring.   3. Encouraged ongoing self management strategies for stress. 4. Followup 6 months or sooner if needed.  Patient continues to refuse psychiatry.  Will address again at next visit.      Relevant Medications   gabapentin (NEURONTIN) 300 MG capsule   QUEtiapine Fumarate (SEROQUEL XR) 150 MG 24 hr tablet   mirtazapine (REMERON) 45 MG tablet    Other Visit Diagnoses    Chronic bronchitis with acute exacerbation (HCC)    -  Primary Repeat bronchitis 2 months from last diagnosis.  Patient with extensive smoking history, likely with simple chronic bronchitis and acute exacerbation today.  Consistent with mild acute exacerbation with worsening productive cough. Similar to prior exacerbation 10/2017. - No hypoxia (96% on RA), afebrile, no recent hospitalization - Continues Albuterol prn.  Is on no maintenance inhalers at this time  Plan: 1. Start Prednisone  50mg  x 5 day steroid burst 2. Use albuterol q 4 hr regularly x 2-3 days.  - START Spiriva 1.25 mcg/act.  Take 2 puffs once daily and continue even when feeling well. 3. Considered antibiotics, however afebrile and no hypoxia, would not be indicated in mild AECOPD, unless recurrent or not improved on steroids 4. RTC about 1 week if not improving, otherwise strict return criteria to go to ED    Relevant Medications   predniSONE (DELTASONE) 50 MG tablet   Tiotropium Bromide Monohydrate (SPIRIVA RESPIMAT) 1.25 MCG/ACT AERS   Hyperkalemia     Patient with hyperkalemia on last labs.  Recheck CMP today.  Followup sooner if persistently elevated.   Relevant Orders   COMPLETE METABOLIC PANEL WITH GFR   Hypercalcemia     Patient with mild hypercalcemia on last labs.  Recheck CMP today.  Followup with additional labs for evaluation if elevation persists.   Relevant Orders   COMPLETE METABOLIC PANEL WITH GFR      Meds ordered this encounter  Medications  . gabapentin (NEURONTIN) 300 MG capsule    Sig: Take 1 capsule (300 mg total) by mouth at bedtime.    Dispense:  30 capsule    Refill:  5    Order Specific Question:   Supervising Provider    Answer:   Smitty Cords [[id number]]  . QUEtiapine Fumarate (SEROQUEL XR) 150 MG 24 hr tablet    Sig: Take 1 tablet (150 mg total) by mouth at bedtime.    Dispense:  30 tablet    Refill:  5    Order Specific Question:   Supervising Provider    Answer:   Smitty Cords [[id number]]  . mirtazapine (REMERON) 45 MG tablet    Sig: Take 1 tablet (45 mg total) by mouth daily.    Dispense:  30 tablet    Refill:  5  Order Specific Question:   Supervising Provider    Answer:   Smitty Cords [[id number]]  . predniSONE (DELTASONE) 50 MG tablet    Sig: Take 1 tablet (50 mg total) by mouth daily with breakfast for 5 days.    Dispense:  5 tablet    Refill:  0    Order Specific Question:   Supervising Provider    Answer:   Smitty Cords  [[id number]]  . Tiotropium Bromide Monohydrate (SPIRIVA RESPIMAT) 1.25 MCG/ACT AERS    Sig: Inhale 2 puffs into the lungs daily.    Dispense:  1 Inhaler    Refill:  5    Order Specific Question:   Supervising Provider    Answer:   Smitty Cords [[id number]]    Follow up plan: Return in about 6 months (around 06/12/2018) for anxiety AND as needed for cough in next 2-4 weeks.  A total of 40 minutes was spent face-to-face with this patient. Greater than 50% of this time (30/40 minutes) was spent in counseling and coordination of care with the patient regarding hypercalcemia (concern for hyperparathyroidism, cancers if persistent), anxiety/depression, likely new COPD diagnosis and new Spiriva dosing/management (rinse, swish, spit after using).   Wilhelmina Mcardle, DNP, AGPCNP-BC Adult Gerontology Primary Care Nurse Practitioner [facility] 12/12/2017, 11:20 AM

## 2017-12-12 NOTE — Patient Instructions (Addendum)
Sonya Evans,   Thank you for coming in to clinic today.  1. Sleep Hygiene Tips  Take medicines only as directed by your health care provider.  Keep regular sleeping and waking hours. Avoid naps.  Keep a sleep diary to help you and your health care provider figure out what could be causing your insomnia. Include:  When you sleep.  When you wake up during the night.  How well you sleep.  How rested you feel the next day.  Any side effects of medicines you are taking.  What you eat and drink.  Make your bedroom a comfortable place where it is easy to fall asleep:  Put up shades or special blackout curtains to block light from outside.  Use a white noise machine to block noise.  Keep the temperature cool.  Exercise regularly as directed by your health care provider. Avoid exercising right before bedtime.  Use relaxation techniques to manage stress. Ask your health care provider to suggest some techniques that may work well for you. These may include:  Breathing exercises.  Routines to release muscle tension.  Visualizing peaceful scenes.  Cut back on alcohol, caffeinated beverages, and cigarettes, especially close to bedtime. These can disrupt your sleep.  Do not overeat or eat spicy foods right before bedtime. This can lead to digestive discomfort that can make it hard for you to sleep.  Limit screen use before bedtime. This includes:  Watching TV.  Using your smartphone, tablet, and computer.  Stick to a routine. This can help you fall asleep faster. Try to do a quiet activity, brush your teeth, and go to bed at the same time each night.  Get out of bed if you are still awake after 15 minutes of trying to sleep. Keep the lights down, but try reading or doing a quiet activity. When you feel sleepy, go back to bed.  Make sure that you drive carefully. Avoid driving if you feel very sleepy.  Keep all follow-up appointments as directed by your health care  provider. This is important.   2. Your cough is caused by a chronic bronchitis with an exacerbation. - . Start Prednisone 50mg  x 5 day steroid burst - Use albuterol q 4 hr regularly x 2-3 days. Continue maintenance inhalers - START Spiriva 2 puffs once daily.  Continue this even when you feel well to prevent future bronchitis flares.  Please schedule a follow-up appointment with Sonya Evans, AGNP. Return in about 6 months (around 06/12/2018) for anxiety AND as needed for cough in next 2-4 weeks.  If you have any other questions or concerns, please feel free to call the clinic or send a message through MyChart. You may also schedule an earlier appointment if necessary.  You will receive a survey after today's visit either digitally by e-mail or paper by [employer]. Your experiences and feedback matter to [place].  Please respond so we know how we are doing as we provide care for you.   Sonya Mcardle, DNP, AGNP-BC Adult Gerontology Nurse Practitioner [facility]

## 2017-12-12 NOTE — Assessment & Plan Note (Signed)
Patient is no longer following with Psychiatry.  Reports no missed doses since stopping that relationship with Dr. Marguerite Olea at [facility].  Patient's anxiety and depression seem stable.  Patient reports no changes in medication doses over the last 1 year.  Plan: 1. Continue seroquel, mirtazapine, gabapentin without changes. 2. Last lipids elevated, could be from seroquel.  Would prefer psychiatry management of anxiety and depression as patient is on medications that require more close monitoring.   3. Encouraged ongoing self management strategies for stress. 4. Followup 6 months or sooner if needed.  Patient continues to refuse psychiatry.  Will address again at next visit.

## 2017-12-13 ENCOUNTER — Other Ambulatory Visit: Payer: Self-pay | Admitting: Nurse Practitioner

## 2017-12-13 DIAGNOSIS — J209 Acute bronchitis, unspecified: Secondary | ICD-10-CM

## 2017-12-13 DIAGNOSIS — J42 Unspecified chronic bronchitis: Principal | ICD-10-CM

## 2017-12-13 MED ORDER — UMECLIDINIUM BROMIDE 62.5 MCG/INH IN AEPB: 1.0000 | INHALATION_SPRAY | Freq: Every day | RESPIRATORY_TRACT | 5 refills | Status: DC

## 2017-12-17 ENCOUNTER — Telehealth: Payer: Self-pay | Admitting: Nurse Practitioner

## 2017-12-17 NOTE — Telephone Encounter (Signed)
Sonya Evans with Cover my meds called about prior authorization for spiriva ref# [id number]

## 2017-12-18 ENCOUNTER — Encounter: Payer: Self-pay | Admitting: Nurse Practitioner

## 2017-12-18 DIAGNOSIS — F329 Major depressive disorder, single episode, unspecified: Secondary | ICD-10-CM

## 2017-12-18 DIAGNOSIS — F32A Depression, unspecified: Secondary | ICD-10-CM

## 2017-12-18 MED ORDER — QUETIAPINE FUMARATE 100 MG PO TABS: 150.0000 mg | ORAL_TABLET | Freq: Every day | ORAL | 0 refills | Status: DC

## 2017-12-25 ENCOUNTER — Encounter: Payer: Self-pay | Admitting: Nurse Practitioner

## 2017-12-25 DIAGNOSIS — K648 Other hemorrhoids: Secondary | ICD-10-CM

## 2017-12-26 ENCOUNTER — Encounter: Payer: Self-pay | Admitting: Nurse Practitioner

## 2017-12-26 ENCOUNTER — Other Ambulatory Visit: Payer: Self-pay

## 2017-12-26 ENCOUNTER — Ambulatory Visit (INDEPENDENT_AMBULATORY_CARE_PROVIDER_SITE_OTHER): Payer: BLUE CROSS/BLUE SHIELD | Admitting: Nurse Practitioner

## 2017-12-26 VITALS — BP 113/78 | HR 102 | Temp 98.9°F | Ht 66.0 in | Wt 187.8 lb

## 2017-12-26 DIAGNOSIS — K922 Gastrointestinal hemorrhage, unspecified: Secondary | ICD-10-CM

## 2017-12-26 DIAGNOSIS — J0100 Acute maxillary sinusitis, unspecified: Secondary | ICD-10-CM | POA: Diagnosis not present

## 2017-12-26 DIAGNOSIS — K648 Other hemorrhoids: Secondary | ICD-10-CM | POA: Diagnosis not present

## 2017-12-26 MED ORDER — HYDROCORTISONE ACETATE 25 MG RE SUPP: 25.0000 mg | Freq: Two times a day (BID) | RECTAL | 0 refills | Status: AC

## 2017-12-26 MED ORDER — AMOXICILLIN-POT CLAVULANATE 875-125 MG PO TABS: 1.0000 | ORAL_TABLET | Freq: Two times a day (BID) | ORAL | 0 refills | Status: AC

## 2017-12-26 MED ORDER — IPRATROPIUM BROMIDE 0.06 % NA SOLN: 2.0000 | Freq: Four times a day (QID) | NASAL | 0 refills | Status: AC

## 2017-12-26 NOTE — Progress Notes (Signed)
Subjective:    Patient ID: Sonya Evans, female    DOB: [birth year], 52 y.o.   MRN: [id number]  Sonya Evans is a 52 y.o. female presenting on 12/26/2017 for Sinus Problem (maxillary sinus pressure, chest congestion and coughing x 14 days )   HPI Sinus Pressure Patient has had onset of sinus pressure, chest congestion, rhinorrhea, cough that began 14 days ago.  Patient was treated for bronchitis.  Gums at top of mouth are hurting.  No other jaw pain.  Mild left ear pressure with mild pain.   - Felt like she had a fever and states "my face felt a little warm, but not to the touch." - Spiriva and prednisone has helped with cough after last visit, but nasal symptoms are persistent.    Stomach Pain Lower abdomen and across at level of pelvis.  This started yesterday am.  Patient passed a very hard bowel movement.  Patient had diarrhea with blood, now just blood.  Has had "plenty of colonoscopies" and has always had diagnosis of hemorrhoids.  She has used "the foam stuff" in past.  Pain goes away.  Patient believes lower abdominal pain is from hemorrhoids. - Is using preparation H with relief of burning on butt. - has had external hemorrhoid once - has had internal hemorrhoid several times in past, but is not predictable in interval between flares.  Patient has had bright red blood bowel movements today.  Ate a sandwich with cheese last night.  Social History   Tobacco Use  . Smoking status: Current Every Day Smoker    Packs/day: 1.00    Types: Cigarettes  . Smokeless tobacco: Never Used  Substance Use Topics  . Alcohol use: No  . Drug use: No    Review of Systems Per HPI unless specifically indicated above     Objective:    BP 113/78 (BP Location: Right Arm, Patient Position: Sitting, Cuff Size: Normal)   Pulse (!) 102   Temp 98.9 F (37.2 C) (Oral)   Ht 5\' 6"  (1.676 m)   Wt 187 lb 12.8 oz (85.2 kg)   SpO2 97%   BMI 30.31 kg/m   Wt Readings from Last 3 Encounters:   12/26/17 187 lb 12.8 oz (85.2 kg)  12/12/17 190 lb 3.2 oz (86.3 kg)  11/14/17 194 lb 3.2 oz (88.1 kg)    Physical Exam  Constitutional: She is oriented to person, place, and time. She appears well-developed and well-nourished. No distress.  HENT:  Head: Normocephalic and atraumatic.  Right Ear: Hearing, tympanic membrane, external ear and ear canal normal.  Left Ear: Hearing, tympanic membrane, external ear and ear canal normal.  Nose: Mucosal edema and rhinorrhea present. Right sinus exhibits maxillary sinus tenderness. Right sinus exhibits no frontal sinus tenderness. Left sinus exhibits maxillary sinus tenderness. Left sinus exhibits no frontal sinus tenderness.  Mouth/Throat: Uvula is midline and mucous membranes are normal. No posterior oropharyngeal edema or posterior oropharyngeal erythema. Oropharyngeal exudate: clear secretions. Tonsils are 0 on the right. Tonsils are 0 on the left.  Eyes: Pupils are equal, round, and reactive to light. Conjunctivae, EOM and lids are normal. Lids are everted and swept, no foreign bodies found. Right eye exhibits no discharge. Left eye exhibits no discharge.  Neck: Normal range of motion. Neck supple.  Cardiovascular: Normal rate, regular rhythm, S1 normal, S2 normal, normal heart sounds and intact distal pulses.  Pulmonary/Chest: Effort normal and breath sounds normal. No respiratory distress.  Abdominal: Soft. Bowel sounds  are normal.  Genitourinary: Rectal exam shows internal hemorrhoid (Currently inflamed) and tenderness. Rectal exam shows no external hemorrhoid, no fissure, no mass, anal tone normal and guaiac negative stool.  Lymphadenopathy:    She has cervical adenopathy.  Neurological: She is alert and oriented to person, place, and time.  Skin: Skin is warm and dry. Capillary refill takes less than 2 seconds.  Psychiatric: She has a normal mood and affect. Her behavior is normal. Judgment and thought content normal.  Vitals reviewed.     Results for orders placed or performed in visit on 12/12/17  COMPLETE METABOLIC PANEL WITH GFR  Result Value Ref Range   Glucose, Bld 111 (H) 65 - 99 mg/dL   BUN 5 (L) 7 - 25 mg/dL   Creat 3.66 4.40 - 3.47 mg/dL   GFR, Est Non African American 65 > OR = 60 mL/min/1.54m2   GFR, Est African American 75 > OR = 60 mL/min/1.26m2   BUN/Creatinine Ratio 5 (L) 6 - 22 (calc)   Sodium 144 135 - 146 mmol/L   Potassium 5.0 3.5 - 5.3 mmol/L   Chloride 105 98 - 110 mmol/L   CO2 28 20 - 32 mmol/L   Calcium 9.7 8.6 - 10.2 mg/dL   Total Protein 7.0 6.1 - 8.1 g/dL   Albumin 4.2 3.6 - 5.1 g/dL   Globulin 2.8 1.9 - 3.7 g/dL (calc)   AG Ratio 1.5 1.0 - 2.5 (calc)   Total Bilirubin 0.4 0.2 - 1.2 mg/dL   Alkaline phosphatase (APISO) 97 33 - 115 U/L   AST 22 10 - 35 U/L   ALT 18 6 - 29 U/L      Assessment & Plan:   Problem List Items Addressed This Visit    None    Visit Diagnoses    Acute non-recurrent maxillary sinusitis    -  Primary   Relevant Medications   ipratropium (ATROVENT) 0.06 % nasal spray   amoxicillin-clavulanate (AUGMENTIN) 875-125 MG tablet   Gastrointestinal hemorrhage, unspecified gastrointestinal hemorrhage type       Internal hemorrhoids          # Consistent with URI and secondary sinusitis with symptoms worsening over the past 7 days and initial symptoms of nasal congestion and sinus pressure over 2 weeks ago.   Plan: 1.START taking Augmentin 875-125 mg tablets every 12 hours for 10 days.  Discussed completing antibiotic. - While on antibiotic, take a probiotic OTC or from food. - Start Atrovent nasal spray decongestant 2 sprays each nostril up to 4 times daily for 5-7 days - Continue anti-histamine loratadine 10mg  daily. - Can use Flonase 2 sprays each nostril daily for up to 4-6 weeks if no epistaxis. - Start Mucinex-DM OTC for  7-10 days prn congestion 2. Supportive care with nasal saline, warm herbal tea with honey, 3. Improve hydration 4. Tylenol / Motrin PRN  fevers  5. Return criteria given   # GI bleeding Acute GI bleed lasting less than 24 hours without significant loss of blood today.  Patient believes this is related to internal hemorrhoids bleeding.  Cannot fully exclude colonic GI bleed.  Plan: 1.  Start Anusol suppositories as provided prior to visit today. 2.  Counseled patient about importance of seeking emergency care if bleeding continues today.  Go to ER if more than 1 additional bloody bowel movement. 3.  Consider GI referral in future for ligation of internal hemorrhoids. 4.  Follow-up in the next 24 to 48 hours if bleeding continues.  Meds ordered this encounter  Medications  . ipratropium (ATROVENT) 0.06 % nasal spray    Sig: Place 2 sprays into both nostrils 4 (four) times daily for 5 days.    Dispense:  15 mL    Refill:  0    Order Specific Question:   Supervising Provider    Answer:   Smitty Cords [[id number]]  . amoxicillin-clavulanate (AUGMENTIN) 875-125 MG tablet    Sig: Take 1 tablet by mouth 2 (two) times daily for 10 days.    Dispense:  20 tablet    Refill:  0    Order Specific Question:   Supervising Provider    Answer:   Smitty Cords [[id number]]    Follow up plan: Return in 5-7 days if symptoms worsen or fail to improve.  Wilhelmina Mcardle, DNP, AGPCNP-BC Adult Gerontology Primary Care Nurse Practitioner [facility] 12/26/2017, 11:30 AM

## 2017-12-26 NOTE — Patient Instructions (Addendum)
Sonya Evans,   Thank you for coming in to clinic today.  1. You have bacterial sinusitis.  Recommend good hand washing. - START taking Augmentin 875-125 mg one tablet twice daily for 10 days.  Make sure to take all doses of your antibiotic. - While you are on an antibiotic, take a probiotic.  Antibiotics kill good and bad bacteria.  A probiotic helps to replace your good bacteria. Probiotic pills can be found over the counter.  One brand is Florastor, but you can use any brand you prefer.  You can also get good bacteria from foods.  These foods are yogurt, kefir, kombucha, and fresh, refrigerated and uncooked sauerkraut. - Drink plenty of fluids.  - Start Atrovent nasal spray decongestant 2 sprays each nostril up to 4 times daily for 5-7 days - Start anti-histamine loratadine or cetirizine 10mg  daily. - You may also use Flonase 2 sprays each nostril daily for up to 4-6 weeks  Other over the counter medications you may try, if needed for symptoms are: - If congestion is worse, start OTC Mucinex (or may try Mucinex-DM for cough) up to 7-10 days then stop - You may try over the counter Nasal Saline spray (Simply Saline, Ocean Spray) as needed to reduce congestion. - Start taking Tylenol extra strength 1 to 2 tablets every 6-8 hours for aches or fever/chills for next few days as needed.  Do not take more than 3,000 mg in 24 hours from all medicines.  You may also take ibuprofen 200-400mg  every 8 hours as needed.   - Drink warm herbal tea with honey for sore throat.   If symptoms are significantly worse with persistent fevers/chills despite tylenol/ibpurofen, nausea, vomiting unable to tolerate food/fluids or medicine, body aches, or shortness of breath, sinus pain pressure or worsening productive cough, then follow-up for re-evaluation, may seek more immediate care at Urgent Care or the ED if you are more concerned that it is an emergency.   2. For your rectal bleeding: You have internal  hemorrhoids, but I am slightly concerned that you may have a GI bleed higher up causing your bloody bowel movements.  If you have any more than 1 more bloody bowel movement today, please seek care at the ER. - Start using Anusol suppository as already prescribed.   Please schedule a follow-up appointment with Sonya Evans, AGNP. Return in 5-7 days if symptoms worsen or fail to improve.  If you have any other questions or concerns, please feel free to call the clinic or send a message through MyChart. You may also schedule an earlier appointment if necessary.  You will receive a survey after today's visit either digitally by e-mail or paper by [employer] mail. Your experiences and feedback matter to us.  Please respond so we know how we are doing as we provide care for you.   Sonya McardleLauren Henry Utsey, DNP, AGNP-BC Adult Gerontology Nurse Practitioner [facility], [facility]

## 2017-12-27 ENCOUNTER — Encounter: Payer: Self-pay | Admitting: Nurse Practitioner

## 2017-12-29 ENCOUNTER — Encounter: Payer: Self-pay | Admitting: Nurse Practitioner

## 2018-01-09 ENCOUNTER — Telehealth: Payer: Self-pay | Admitting: Nurse Practitioner

## 2018-01-09 DIAGNOSIS — F329 Major depressive disorder, single episode, unspecified: Secondary | ICD-10-CM

## 2018-01-09 DIAGNOSIS — F32A Depression, unspecified: Secondary | ICD-10-CM

## 2018-01-09 MED ORDER — QUETIAPINE FUMARATE 300 MG PO TABS: 150.0000 mg | ORAL_TABLET | Freq: Every day | ORAL | 1 refills | Status: DC

## 2018-01-09 NOTE — Telephone Encounter (Signed)
Pt called requesting refill on Quetiapine (she is requesting the 300 mg said it was easier to cut)  Pt call back # is [phone]. [facility]

## 2018-01-09 NOTE — Telephone Encounter (Signed)
Prescription refilled and changed to 1/2 tab daily at bedtime.  Continue 150 mg per dose.

## 2018-01-23 ENCOUNTER — Other Ambulatory Visit: Payer: Self-pay | Admitting: Nurse Practitioner

## 2018-01-23 ENCOUNTER — Ambulatory Visit (INDEPENDENT_AMBULATORY_CARE_PROVIDER_SITE_OTHER): Payer: BLUE CROSS/BLUE SHIELD

## 2018-01-23 DIAGNOSIS — Z23 Encounter for immunization: Secondary | ICD-10-CM | POA: Diagnosis not present

## 2018-01-23 DIAGNOSIS — B354 Tinea corporis: Secondary | ICD-10-CM

## 2018-01-23 DIAGNOSIS — B350 Tinea barbae and tinea capitis: Secondary | ICD-10-CM

## 2018-02-15 ENCOUNTER — Encounter: Payer: Self-pay | Admitting: Nurse Practitioner

## 2018-04-12 ENCOUNTER — Other Ambulatory Visit: Payer: Self-pay | Admitting: Nurse Practitioner

## 2018-04-12 DIAGNOSIS — E785 Hyperlipidemia, unspecified: Secondary | ICD-10-CM

## 2018-04-12 DIAGNOSIS — K219 Gastro-esophageal reflux disease without esophagitis: Secondary | ICD-10-CM

## 2018-05-11 ENCOUNTER — Other Ambulatory Visit: Payer: Self-pay | Admitting: Nurse Practitioner

## 2018-05-11 DIAGNOSIS — F32A Depression, unspecified: Secondary | ICD-10-CM

## 2018-05-11 DIAGNOSIS — F329 Major depressive disorder, single episode, unspecified: Secondary | ICD-10-CM

## 2018-05-25 ENCOUNTER — Other Ambulatory Visit: Payer: Self-pay | Admitting: Nurse Practitioner

## 2018-05-25 DIAGNOSIS — B354 Tinea corporis: Secondary | ICD-10-CM

## 2018-05-25 DIAGNOSIS — B350 Tinea barbae and tinea capitis: Secondary | ICD-10-CM

## 2018-05-25 DIAGNOSIS — J209 Acute bronchitis, unspecified: Secondary | ICD-10-CM

## 2018-05-25 DIAGNOSIS — J42 Unspecified chronic bronchitis: Principal | ICD-10-CM

## 2018-06-09 ENCOUNTER — Other Ambulatory Visit: Payer: Self-pay | Admitting: Nurse Practitioner

## 2018-06-09 DIAGNOSIS — F329 Major depressive disorder, single episode, unspecified: Secondary | ICD-10-CM

## 2018-06-09 DIAGNOSIS — F32A Depression, unspecified: Secondary | ICD-10-CM

## 2018-06-26 ENCOUNTER — Telehealth (INDEPENDENT_AMBULATORY_CARE_PROVIDER_SITE_OTHER): Payer: BLUE CROSS/BLUE SHIELD | Admitting: Nurse Practitioner

## 2018-06-26 ENCOUNTER — Telehealth: Payer: BLUE CROSS/BLUE SHIELD | Admitting: Nurse Practitioner

## 2018-06-26 DIAGNOSIS — F32A Depression, unspecified: Secondary | ICD-10-CM

## 2018-06-26 DIAGNOSIS — L210 Seborrhea capitis: Secondary | ICD-10-CM

## 2018-06-26 DIAGNOSIS — J42 Unspecified chronic bronchitis: Secondary | ICD-10-CM

## 2018-06-26 DIAGNOSIS — B350 Tinea barbae and tinea capitis: Secondary | ICD-10-CM

## 2018-06-26 DIAGNOSIS — J189 Pneumonia, unspecified organism: Secondary | ICD-10-CM

## 2018-06-26 DIAGNOSIS — F329 Major depressive disorder, single episode, unspecified: Secondary | ICD-10-CM

## 2018-06-26 DIAGNOSIS — B354 Tinea corporis: Secondary | ICD-10-CM

## 2018-06-26 DIAGNOSIS — J209 Acute bronchitis, unspecified: Secondary | ICD-10-CM

## 2018-06-26 MED ORDER — PROAIR HFA 108 (90 BASE) MCG/ACT IN AERS: 1.0000 | INHALATION_SPRAY | Freq: Four times a day (QID) | RESPIRATORY_TRACT | 1 refills | Status: AC | PRN

## 2018-06-26 MED ORDER — AZITHROMYCIN 250 MG PO TABS: ORAL_TABLET | ORAL | 0 refills | Status: AC

## 2018-06-26 MED ORDER — TRIAMCINOLONE ACETONIDE 0.1 % EX CREA: 1.0000 "application " | TOPICAL_CREAM | Freq: Two times a day (BID) | CUTANEOUS | 0 refills | Status: AC

## 2018-06-26 MED ORDER — GABAPENTIN 400 MG PO CAPS: 300.0000 mg | ORAL_CAPSULE | Freq: Every day | ORAL | 5 refills | Status: DC

## 2018-06-26 MED ORDER — UMECLIDINIUM-VILANTEROL 62.5-25 MCG/INH IN AEPB: 1.0000 | INHALATION_SPRAY | Freq: Every day | RESPIRATORY_TRACT | 5 refills | Status: DC

## 2018-06-26 MED ORDER — QUETIAPINE FUMARATE 200 MG PO TABS: 200.0000 mg | ORAL_TABLET | Freq: Every day | ORAL | 5 refills | Status: DC

## 2018-06-26 NOTE — Progress Notes (Signed)
Subjective:    Patient ID: Sonya Evans, female    DOB: December 23, 1965, 53 y.o.   MRN: [id number]  Sonya Evans is a 53 y.o. female presenting on 06/26/2018 for Chronic diseases follow-up: Anxiety, COPD  HPI COPD: Incruse has been helping breathing.  Patient notes some shortness of breath with activity at this time.  Continues albuterol prn.  Patient is concerned about her increased hot flashes/night sweats and URI symptoms.   URI: Patient is noticing mild cough/congestion with nasal/sinus pressure.  Patient notes she now has dark green production with odor.  Stringy, thick mucous - Notes worsening hot flashes at night now than in past. Otherwise denies fever, chills, or sweats. She has had perimenopausal hot flashes and notes previously, meds have helped hot flashes. These were every hour before meds and decreased to nearly none.  Currently, having 2-3 flashes regularly at night.  Dermatitis Patient continues to have rash, flaking on crown and behind ears.   Depression, insomnia Patient continues to have some mild trouble sleeping with increased stress - can't wind down at night after work.  Occasionally still has heart racing.  Last night was "rough."  But normally isn't bad problem.   Occurs 2 times per week.  This is consistent with panic attack vs PTSD. Patient currently managed on seroquel 100 mg at bedtime with gabapentin 300 mg bedtime.  Depression screen [facility] 2/9 06/26/2018 12/12/2017  Decreased Interest 0 0  Down, Depressed, Hopeless 0 0  PHQ - 2 Score 0 0  Altered sleeping 2 2  Tired, decreased energy 1 3  Change in appetite 1 0  Feeling bad or failure about yourself  0 0  Trouble concentrating 0 1  Moving slowly or fidgety/restless 0 0  Suicidal thoughts 0 0  PHQ-9 Score 4 6  Difficult doing work/chores Not difficult at all -    GAD 7 : Generalized Anxiety Score 06/26/2018 12/12/2017 08/09/2015  Nervous, Anxious, on Edge 1 1 2   Control/stop worrying 3 1 2   Worry too  much - different things 1 1 2   Trouble relaxing 1 0 3  Restless 0 0 0  Easily annoyed or irritable 0 0 0  Afraid - awful might happen 0 0 0  Total GAD 7 Score 6 3 9   Anxiety Difficulty Somewhat difficult Not difficult at all Not difficult at all     Social History   Tobacco Use  . Smoking status: Current Every Day Smoker    Packs/day: 1.00    Types: Cigarettes  . Smokeless tobacco: Never Used  Substance Use Topics  . Alcohol use: No  . Drug use: No    Review of Systems Per HPI unless specifically indicated above     Objective:    There were no vitals taken for this visit.  Wt Readings from Last 3 Encounters:  12/26/17 187 lb 12.8 oz (85.2 kg)  12/12/17 190 lb 3.2 oz (86.3 kg)  11/14/17 194 lb 3.2 oz (88.1 kg)    Physical Exam Patient remotely monitored.  Verbal communication appropriate.  Cognition normal.   Results for orders placed or performed in visit on 12/12/17  COMPLETE METABOLIC PANEL WITH GFR  Result Value Ref Range   Glucose, Bld 111 (H) 65 - 99 mg/dL   BUN 5 (L) 7 - 25 mg/dL   Creat 0.86 7.61 - 9.50 mg/dL   GFR, Est Non African American 65 > OR = 60 mL/min/1.52m2   GFR, Est African American 75 > OR =  60 mL/min/1.53m2   BUN/Creatinine Ratio 5 (L) 6 - 22 (calc)   Sodium 144 135 - 146 mmol/L   Potassium 5.0 3.5 - 5.3 mmol/L   Chloride 105 98 - 110 mmol/L   CO2 28 20 - 32 mmol/L   Calcium 9.7 8.6 - 10.2 mg/dL   Total Protein 7.0 6.1 - 8.1 g/dL   Albumin 4.2 3.6 - 5.1 g/dL   Globulin 2.8 1.9 - 3.7 g/dL (calc)   AG Ratio 1.5 1.0 - 2.5 (calc)   Total Bilirubin 0.4 0.2 - 1.2 mg/dL   Alkaline phosphatase (APISO) 97 33 - 115 U/L   AST 22 10 - 35 U/L   ALT 18 6 - 29 U/L      Assessment & Plan:   Problem List Items Addressed This Visit      Other   Clinical depression Mildly worsening with continued difficulty sleeping.  Increase gabapentin to 400 mg for night time dosing for hot flashes.   Increase seroquel to 200 mg at bedtime. Recommend patient  consider psychiatry referral in future if medication demands continue increasing/ remains uncontrolled.   Relevant Medications   QUEtiapine (SEROQUEL) 200 MG tablet   gabapentin (NEURONTIN) 400 MG capsule    Other Visit Diagnoses    Tinea capitis       Tinea corporis     Flaky scalp    -  Primary  Relevant Medications  triamcinolone cream (KENALOG) 0.1 %  Other Relevant Orders  Ambulatory referral to Dermatology   Patient with continued flaky scalp and skin.  Not improved significantly with antifungal.  Pruritis has improved.  Cannot exclude psoriasis.  Unable to physically examine today. - CHANGE to triamcinolone  - Offered and patient prefers to have referral.  Referral dermatology placed - Follow-up prn.    Chronic bronchitis with acute exacerbation (HCC)       Relevant Medications   PROAIR HFA 108 (90 Base) MCG/ACT inhaler   umeclidinium-vilanterol (ANORO ELLIPTA) 62.5-25 MCG/INH AEPB   azithromycin (ZITHROMAX) 250 MG tablet   Community acquired pneumonia, unspecified laterality       Relevant Medications   PROAIR HFA 108 (90 Base) MCG/ACT inhaler   umeclidinium-vilanterol (ANORO ELLIPTA) 62.5-25 MCG/INH AEPB   azithromycin (ZITHROMAX) 250 MG tablet     Consistent with mild acute exacerbation of COPD with worsening productive cough. - No hypoxia signs and symptoms to include confusion, shortness of breath at rest. Cannot exclude fever with increased night sweats.  No recent hospitalization - Continues Albuterol, Incruse  Plan: 1. Start Anoro instead of Incruse 2. Use albuterol q 4 hr regularly x 2-3 days. Continue maintenance inhalers 3. START azithromycin 250 mg tablet. Take 2 tablets today.  Then, take 1 tablet daily for the next 4 days and stop. 4. RTC about 1 week if not improving, otherwise strict return criteria to go to ED    Meds ordered this encounter  Medications  . QUEtiapine (SEROQUEL) 200 MG tablet    Sig: Take 1 tablet (200 mg total) by mouth at bedtime.     Dispense:  30 tablet    Refill:  5    Order Specific Question:   Supervising Provider    Answer:   Sonya Evans [[id number]]  . triamcinolone cream (KENALOG) 0.1 %    Sig: Apply 1 application topically 2 (two) times daily for 14 days. Repeat as needed after 5 day break    Dispense:  30 g    Refill:  0  Order Specific Question:   Supervising Provider    Answer:   Sonya Evans [[id number]]  . PROAIR HFA 108 (90 Base) MCG/ACT inhaler    Sig: Inhale 1-2 puffs into the lungs every 6 (six) hours as needed for wheezing or shortness of breath.    Dispense:  8 g    Refill:  1    Order Specific Question:   Supervising Provider    Answer:   Sonya Evans [[id number]]  . umeclidinium-vilanterol (ANORO ELLIPTA) 62.5-25 MCG/INH AEPB    Sig: Inhale 1 puff into the lungs daily.    Dispense:  30 each    Refill:  5    Order Specific Question:   Supervising Provider    Answer:   Sonya Evans [[id number]]  . gabapentin (NEURONTIN) 400 MG capsule    Sig: Take 1 capsule (400 mg total) by mouth at bedtime.    Dispense:  30 capsule    Refill:  5    Order Specific Question:   Supervising Provider    Answer:   Sonya Evans [[id number]]  . azithromycin (ZITHROMAX) 250 MG tablet    Sig: Take one tablet twice today.  Then, take 1 tablet daily for 4 days.    Dispense:  6 tablet    Refill:  0    Order Specific Question:   Supervising Provider    Answer:   Sonya Evans [[id number]]    Disclosed to patient at start of encounter that we will bill telephone services and she will receive bill of services provided.  Patient consents to be treated via phone prior to discussion. - Patient is at her home and is accessed via telephone. - Services are provided from [facility]. - Time spent in direct consultation with patient on phone: 25 minutes  Follow up plan: Follow-up 3 months   Wilhelmina Mcardle, DNP, AGPCNP-BC Adult Gerontology Primary Care Nurse  Practitioner [facility] 07/01/2018, 8:21 AM

## 2018-06-26 NOTE — Patient Instructions (Signed)
Sonya Evans,   Thank you for coming in to clinic today.  1. You may have pneumonia.   - START azithromycin 250 mg tablet. Take 2 tablets today.  Then, take 1 tablet daily for  the next 4 days and stop. - If not improving we will recommend chest xray and possible office visit.  2. Referral to [facility] for your scalp and fingers with flaky skin.  3. CHANGE: - STOP Incruse - START Anoro one inhalation daily (check online for savings card)  - Increase gabapentin to 400 mg at bedtime - Increase seroquel to 200 mg at bedtime - Caution drowsiness during day.   Sleep Hygiene Tips  Take medicines only as directed by your health care provider.  Keep regular sleeping and waking hours. Avoid naps.  Keep a sleep diary to help you and your health care provider figure out what could be causing your insomnia. Include:  When you sleep.  When you wake up during the night.  How well you sleep.  How rested you feel the next day.  Any side effects of medicines you are taking.  What you eat and drink.  Make your bedroom a comfortable place where it is easy to fall asleep:  Put up shades or special blackout curtains to block light from outside.  Use a white noise machine to block noise.  Keep the temperature cool.  Exercise regularly as directed by your health care provider. Avoid exercising right before bedtime.  Use relaxation techniques to manage stress. Ask your health care provider to suggest some techniques that may work well for you. These may include:  Breathing exercises.  Routines to release muscle tension.  Visualizing peaceful scenes.  Cut back on alcohol, caffeinated beverages, and cigarettes, especially close to bedtime. These can disrupt your sleep.  Do not overeat or eat spicy foods right before bedtime. This can lead to digestive discomfort that can make it hard for you to sleep.  Limit screen use before bedtime. This includes:  Watching  TV.  Using your smartphone, tablet, and computer.  Stick to a routine. This can help you fall asleep faster. Try to do a quiet activity, brush your teeth, and go to bed at the same time each night.  Get out of bed if you are still awake after 15 minutes of trying to sleep. Keep the lights down, but try reading or doing a quiet activity. When you feel sleepy, go back to bed.  Make sure that you drive carefully. Avoid driving if you feel very sleepy.  Keep all follow-up appointments as directed by your health care provider. This is important.  Please schedule a follow-up appointment with Wilhelmina Mcardle, AGNP.  Follow-up 3 months.  If you have any other questions or concerns, please feel free to call the clinic or send a message through MyChart. You may also schedule an earlier appointment if necessary.  You will receive a survey after today's visit either digitally by e-mail or paper by [employer]. Your experiences and feedback matter to [place].  Please respond so we know how we are doing as we provide care for you.   Wilhelmina Mcardle, DNP, AGNP-BC Adult Gerontology Nurse Practitioner [facility], [facility]

## 2018-07-08 ENCOUNTER — Other Ambulatory Visit: Payer: Self-pay | Admitting: Nurse Practitioner

## 2018-07-08 DIAGNOSIS — F32A Depression, unspecified: Secondary | ICD-10-CM

## 2018-07-08 DIAGNOSIS — F329 Major depressive disorder, single episode, unspecified: Secondary | ICD-10-CM

## 2018-07-08 DIAGNOSIS — K219 Gastro-esophageal reflux disease without esophagitis: Secondary | ICD-10-CM

## 2018-07-08 DIAGNOSIS — E785 Hyperlipidemia, unspecified: Secondary | ICD-10-CM

## 2018-10-02 ENCOUNTER — Ambulatory Visit: Payer: BLUE CROSS/BLUE SHIELD | Admitting: Nurse Practitioner

## 2018-10-05 ENCOUNTER — Other Ambulatory Visit: Payer: Self-pay | Admitting: Nurse Practitioner

## 2018-10-05 DIAGNOSIS — F329 Major depressive disorder, single episode, unspecified: Secondary | ICD-10-CM

## 2018-10-05 DIAGNOSIS — E785 Hyperlipidemia, unspecified: Secondary | ICD-10-CM

## 2018-10-05 DIAGNOSIS — K219 Gastro-esophageal reflux disease without esophagitis: Secondary | ICD-10-CM

## 2018-10-05 DIAGNOSIS — F32A Depression, unspecified: Secondary | ICD-10-CM

## 2018-11-06 ENCOUNTER — Ambulatory Visit: Payer: BLUE CROSS/BLUE SHIELD | Admitting: Nurse Practitioner

## 2018-11-12 DIAGNOSIS — M79672 Pain in left foot: Secondary | ICD-10-CM | POA: Diagnosis not present

## 2018-11-12 DIAGNOSIS — S99922A Unspecified injury of left foot, initial encounter: Secondary | ICD-10-CM | POA: Diagnosis not present

## 2018-11-12 DIAGNOSIS — S93602A Unspecified sprain of left foot, initial encounter: Secondary | ICD-10-CM | POA: Diagnosis not present

## 2018-11-19 ENCOUNTER — Other Ambulatory Visit: Payer: Self-pay | Admitting: Nurse Practitioner

## 2018-11-19 DIAGNOSIS — F32A Depression, unspecified: Secondary | ICD-10-CM

## 2018-11-19 DIAGNOSIS — F329 Major depressive disorder, single episode, unspecified: Secondary | ICD-10-CM

## 2018-12-04 DIAGNOSIS — L400 Psoriasis vulgaris: Secondary | ICD-10-CM | POA: Diagnosis not present

## 2018-12-04 DIAGNOSIS — D485 Neoplasm of uncertain behavior of skin: Secondary | ICD-10-CM | POA: Diagnosis not present

## 2018-12-04 DIAGNOSIS — L570 Actinic keratosis: Secondary | ICD-10-CM | POA: Diagnosis not present

## 2018-12-04 DIAGNOSIS — C44311 Basal cell carcinoma of skin of nose: Secondary | ICD-10-CM | POA: Diagnosis not present

## 2018-12-17 ENCOUNTER — Other Ambulatory Visit: Payer: Self-pay | Admitting: Nurse Practitioner

## 2018-12-17 DIAGNOSIS — F32A Depression, unspecified: Secondary | ICD-10-CM

## 2018-12-17 DIAGNOSIS — F329 Major depressive disorder, single episode, unspecified: Secondary | ICD-10-CM

## 2018-12-17 DIAGNOSIS — J42 Unspecified chronic bronchitis: Secondary | ICD-10-CM

## 2018-12-17 DIAGNOSIS — J209 Acute bronchitis, unspecified: Secondary | ICD-10-CM

## 2019-01-05 ENCOUNTER — Other Ambulatory Visit: Payer: Self-pay | Admitting: Family Medicine

## 2019-01-05 DIAGNOSIS — K219 Gastro-esophageal reflux disease without esophagitis: Secondary | ICD-10-CM

## 2019-01-05 DIAGNOSIS — E785 Hyperlipidemia, unspecified: Secondary | ICD-10-CM

## 2019-01-05 DIAGNOSIS — F329 Major depressive disorder, single episode, unspecified: Secondary | ICD-10-CM

## 2019-01-05 DIAGNOSIS — F32A Depression, unspecified: Secondary | ICD-10-CM

## 2019-01-20 ENCOUNTER — Other Ambulatory Visit: Payer: Self-pay | Admitting: Nurse Practitioner

## 2019-01-20 DIAGNOSIS — J209 Acute bronchitis, unspecified: Secondary | ICD-10-CM

## 2019-01-20 DIAGNOSIS — F32A Depression, unspecified: Secondary | ICD-10-CM

## 2019-01-20 DIAGNOSIS — F329 Major depressive disorder, single episode, unspecified: Secondary | ICD-10-CM

## 2019-01-30 DIAGNOSIS — L814 Other melanin hyperpigmentation: Secondary | ICD-10-CM | POA: Diagnosis not present

## 2019-01-30 DIAGNOSIS — C44311 Basal cell carcinoma of skin of nose: Secondary | ICD-10-CM | POA: Diagnosis not present

## 2019-01-30 DIAGNOSIS — L578 Other skin changes due to chronic exposure to nonionizing radiation: Secondary | ICD-10-CM | POA: Diagnosis not present

## 2019-01-30 DIAGNOSIS — D0462 Carcinoma in situ of skin of left upper limb, including shoulder: Secondary | ICD-10-CM | POA: Diagnosis not present

## 2019-01-30 DIAGNOSIS — L570 Actinic keratosis: Secondary | ICD-10-CM | POA: Diagnosis not present

## 2019-01-30 DIAGNOSIS — L820 Inflamed seborrheic keratosis: Secondary | ICD-10-CM | POA: Diagnosis not present

## 2019-01-30 DIAGNOSIS — L988 Other specified disorders of the skin and subcutaneous tissue: Secondary | ICD-10-CM | POA: Diagnosis not present

## 2019-02-07 ENCOUNTER — Other Ambulatory Visit: Payer: Self-pay | Admitting: Nurse Practitioner

## 2019-02-07 DIAGNOSIS — F32A Depression, unspecified: Secondary | ICD-10-CM

## 2019-02-07 DIAGNOSIS — K219 Gastro-esophageal reflux disease without esophagitis: Secondary | ICD-10-CM

## 2019-02-07 DIAGNOSIS — F329 Major depressive disorder, single episode, unspecified: Secondary | ICD-10-CM

## 2019-02-07 DIAGNOSIS — E785 Hyperlipidemia, unspecified: Secondary | ICD-10-CM

## 2019-02-12 ENCOUNTER — Other Ambulatory Visit: Payer: Self-pay | Admitting: Nurse Practitioner

## 2019-02-12 DIAGNOSIS — J209 Acute bronchitis, unspecified: Secondary | ICD-10-CM

## 2019-02-12 DIAGNOSIS — F329 Major depressive disorder, single episode, unspecified: Secondary | ICD-10-CM

## 2019-02-12 DIAGNOSIS — F32A Depression, unspecified: Secondary | ICD-10-CM

## 2019-04-10 DIAGNOSIS — Z85828 Personal history of other malignant neoplasm of skin: Secondary | ICD-10-CM | POA: Diagnosis not present

## 2019-04-10 DIAGNOSIS — L570 Actinic keratosis: Secondary | ICD-10-CM | POA: Diagnosis not present

## 2019-04-10 DIAGNOSIS — Z86007 Personal history of in-situ neoplasm of skin: Secondary | ICD-10-CM | POA: Diagnosis not present

## 2019-04-17 DIAGNOSIS — K0889 Other specified disorders of teeth and supporting structures: Secondary | ICD-10-CM | POA: Diagnosis not present

## 2019-05-17 ENCOUNTER — Other Ambulatory Visit: Payer: Self-pay | Admitting: Nurse Practitioner

## 2019-05-17 DIAGNOSIS — F329 Major depressive disorder, single episode, unspecified: Secondary | ICD-10-CM

## 2019-05-17 DIAGNOSIS — J209 Acute bronchitis, unspecified: Secondary | ICD-10-CM

## 2019-05-17 DIAGNOSIS — F32A Depression, unspecified: Secondary | ICD-10-CM

## 2019-05-30 ENCOUNTER — Other Ambulatory Visit: Payer: Self-pay | Admitting: Family Medicine

## 2019-05-30 DIAGNOSIS — K219 Gastro-esophageal reflux disease without esophagitis: Secondary | ICD-10-CM

## 2019-05-30 DIAGNOSIS — F32A Depression, unspecified: Secondary | ICD-10-CM

## 2019-05-30 DIAGNOSIS — E785 Hyperlipidemia, unspecified: Secondary | ICD-10-CM

## 2019-05-30 DIAGNOSIS — F329 Major depressive disorder, single episode, unspecified: Secondary | ICD-10-CM

## 2019-06-02 ENCOUNTER — Telehealth: Payer: Self-pay | Admitting: Nurse Practitioner

## 2019-06-02 NOTE — Telephone Encounter (Signed)
error 

## 2019-06-26 DIAGNOSIS — K2900 Acute gastritis without bleeding: Secondary | ICD-10-CM | POA: Diagnosis not present

## 2019-06-26 DIAGNOSIS — R1012 Left upper quadrant pain: Secondary | ICD-10-CM | POA: Diagnosis not present

## 2019-06-26 DIAGNOSIS — H9313 Tinnitus, bilateral: Secondary | ICD-10-CM | POA: Diagnosis not present

## 2019-07-03 DIAGNOSIS — J301 Allergic rhinitis due to pollen: Secondary | ICD-10-CM | POA: Diagnosis not present

## 2019-07-03 DIAGNOSIS — M6248 Contracture of muscle, other site: Secondary | ICD-10-CM | POA: Diagnosis not present

## 2019-07-03 DIAGNOSIS — H93299 Other abnormal auditory perceptions, unspecified ear: Secondary | ICD-10-CM | POA: Diagnosis not present

## 2019-07-03 DIAGNOSIS — H9313 Tinnitus, bilateral: Secondary | ICD-10-CM | POA: Diagnosis not present

## 2019-07-09 ENCOUNTER — Other Ambulatory Visit: Payer: Self-pay | Admitting: Otolaryngology

## 2019-07-09 DIAGNOSIS — R519 Headache, unspecified: Secondary | ICD-10-CM

## 2019-07-17 DIAGNOSIS — R252 Cramp and spasm: Secondary | ICD-10-CM | POA: Diagnosis not present

## 2019-07-17 DIAGNOSIS — Z114 Encounter for screening for human immunodeficiency virus [HIV]: Secondary | ICD-10-CM | POA: Diagnosis not present

## 2019-07-17 DIAGNOSIS — E785 Hyperlipidemia, unspecified: Secondary | ICD-10-CM | POA: Diagnosis not present

## 2019-07-17 DIAGNOSIS — F17200 Nicotine dependence, unspecified, uncomplicated: Secondary | ICD-10-CM | POA: Diagnosis not present

## 2019-07-17 DIAGNOSIS — R05 Cough: Secondary | ICD-10-CM | POA: Diagnosis not present

## 2019-07-21 ENCOUNTER — Other Ambulatory Visit: Payer: Self-pay | Admitting: Infectious Diseases

## 2019-07-21 DIAGNOSIS — R9389 Abnormal findings on diagnostic imaging of other specified body structures: Secondary | ICD-10-CM

## 2019-07-21 DIAGNOSIS — R05 Cough: Secondary | ICD-10-CM

## 2019-07-21 DIAGNOSIS — R053 Chronic cough: Secondary | ICD-10-CM

## 2019-07-22 ENCOUNTER — Other Ambulatory Visit: Payer: Self-pay

## 2019-07-22 ENCOUNTER — Ambulatory Visit
Admission: RE | Admit: 2019-07-22 | Discharge: 2019-07-22 | Disposition: A | Discharge status: Home / Self Care | Payer: BC Managed Care – PPO | Source: Ambulatory Visit | Attending: Otolaryngology | Admitting: Otolaryngology

## 2019-07-22 DIAGNOSIS — G245 Blepharospasm: Secondary | ICD-10-CM | POA: Diagnosis not present

## 2019-07-22 DIAGNOSIS — R519 Headache, unspecified: Secondary | ICD-10-CM | POA: Insufficient documentation

## 2019-07-22 MED ORDER — GADOBUTROL 1 MMOL/ML IV SOLN
8.0000 mL | Freq: Once | INTRAVENOUS | Status: AC | PRN
  Administered 2019-07-22: 8 mL via INTRAVENOUS

## 2019-07-27 ENCOUNTER — Other Ambulatory Visit: Payer: Self-pay | Admitting: Family Medicine

## 2019-07-27 DIAGNOSIS — F32A Depression, unspecified: Secondary | ICD-10-CM

## 2019-07-27 DIAGNOSIS — E785 Hyperlipidemia, unspecified: Secondary | ICD-10-CM

## 2019-07-27 DIAGNOSIS — F329 Major depressive disorder, single episode, unspecified: Secondary | ICD-10-CM

## 2019-07-27 DIAGNOSIS — K219 Gastro-esophageal reflux disease without esophagitis: Secondary | ICD-10-CM

## 2019-07-27 NOTE — Telephone Encounter (Signed)
Pt requesting refills. Pt overdue for appt. Per chart pt has a different PCP listed, but [facility] filled last refill. LM for pt to call back and make appt for med refills. Routing note of [facility].  PCP on chart is a physician at [facility] clinic.

## 2019-07-28 NOTE — Telephone Encounter (Signed)
Patient is no longer followed by our office [facility].  She has established with new PCP at [facility]. Dr Jasmine December Nicoletta Ba, DO [facility] 07/28/2019, 12:55 PM

## 2019-07-31 DIAGNOSIS — R9389 Abnormal findings on diagnostic imaging of other specified body structures: Secondary | ICD-10-CM | POA: Diagnosis not present

## 2019-07-31 DIAGNOSIS — R311 Benign essential microscopic hematuria: Secondary | ICD-10-CM | POA: Diagnosis not present

## 2019-07-31 DIAGNOSIS — E785 Hyperlipidemia, unspecified: Secondary | ICD-10-CM | POA: Diagnosis not present

## 2019-07-31 DIAGNOSIS — H9313 Tinnitus, bilateral: Secondary | ICD-10-CM | POA: Diagnosis not present

## 2019-07-31 DIAGNOSIS — R05 Cough: Secondary | ICD-10-CM | POA: Diagnosis not present

## 2019-08-09 ENCOUNTER — Other Ambulatory Visit: Payer: Self-pay | Admitting: Family Medicine

## 2019-08-09 DIAGNOSIS — F329 Major depressive disorder, single episode, unspecified: Secondary | ICD-10-CM

## 2019-08-09 DIAGNOSIS — F32A Depression, unspecified: Secondary | ICD-10-CM

## 2019-08-28 ENCOUNTER — Other Ambulatory Visit: Payer: Self-pay

## 2019-08-28 ENCOUNTER — Ambulatory Visit
Admission: RE | Admit: 2019-08-28 | Discharge: 2019-08-28 | Disposition: A | Discharge status: Home / Self Care | Payer: BC Managed Care – PPO | Source: Ambulatory Visit | Attending: Infectious Diseases | Admitting: Infectious Diseases

## 2019-08-28 DIAGNOSIS — R05 Cough: Secondary | ICD-10-CM | POA: Diagnosis not present

## 2019-08-28 DIAGNOSIS — R9389 Abnormal findings on diagnostic imaging of other specified body structures: Secondary | ICD-10-CM | POA: Diagnosis not present

## 2019-08-28 DIAGNOSIS — R053 Chronic cough: Secondary | ICD-10-CM

## 2019-08-28 DIAGNOSIS — J9809 Other diseases of bronchus, not elsewhere classified: Secondary | ICD-10-CM | POA: Diagnosis not present

## 2019-08-28 DIAGNOSIS — J432 Centrilobular emphysema: Secondary | ICD-10-CM | POA: Diagnosis not present

## 2019-09-11 ENCOUNTER — Other Ambulatory Visit: Payer: Self-pay | Admitting: Family Medicine

## 2019-09-11 DIAGNOSIS — K760 Fatty (change of) liver, not elsewhere classified: Secondary | ICD-10-CM | POA: Diagnosis not present

## 2019-09-11 DIAGNOSIS — R002 Palpitations: Secondary | ICD-10-CM | POA: Diagnosis not present

## 2019-09-11 DIAGNOSIS — E785 Hyperlipidemia, unspecified: Secondary | ICD-10-CM | POA: Diagnosis not present

## 2019-09-11 DIAGNOSIS — F329 Major depressive disorder, single episode, unspecified: Secondary | ICD-10-CM | POA: Diagnosis not present

## 2019-09-11 DIAGNOSIS — F32A Depression, unspecified: Secondary | ICD-10-CM

## 2019-09-11 DIAGNOSIS — J449 Chronic obstructive pulmonary disease, unspecified: Secondary | ICD-10-CM | POA: Diagnosis not present

## 2019-09-11 DIAGNOSIS — R311 Benign essential microscopic hematuria: Secondary | ICD-10-CM | POA: Diagnosis not present

## 2019-10-23 DIAGNOSIS — F329 Major depressive disorder, single episode, unspecified: Secondary | ICD-10-CM | POA: Diagnosis not present

## 2019-10-23 DIAGNOSIS — F419 Anxiety disorder, unspecified: Secondary | ICD-10-CM | POA: Diagnosis not present

## 2019-10-23 DIAGNOSIS — F17200 Nicotine dependence, unspecified, uncomplicated: Secondary | ICD-10-CM | POA: Diagnosis not present

## 2020-01-29 ENCOUNTER — Other Ambulatory Visit: Payer: Self-pay | Admitting: Infectious Diseases

## 2020-01-29 DIAGNOSIS — G8929 Other chronic pain: Secondary | ICD-10-CM | POA: Diagnosis not present

## 2020-01-29 DIAGNOSIS — M545 Low back pain, unspecified: Secondary | ICD-10-CM | POA: Diagnosis not present

## 2020-01-29 DIAGNOSIS — Z1231 Encounter for screening mammogram for malignant neoplasm of breast: Secondary | ICD-10-CM | POA: Diagnosis not present

## 2020-01-29 DIAGNOSIS — Z23 Encounter for immunization: Secondary | ICD-10-CM | POA: Diagnosis not present

## 2020-01-29 DIAGNOSIS — K581 Irritable bowel syndrome with constipation: Secondary | ICD-10-CM | POA: Diagnosis not present

## 2020-01-29 DIAGNOSIS — K219 Gastro-esophageal reflux disease without esophagitis: Secondary | ICD-10-CM | POA: Diagnosis not present

## 2020-01-29 DIAGNOSIS — E785 Hyperlipidemia, unspecified: Secondary | ICD-10-CM | POA: Diagnosis not present

## 2020-01-29 DIAGNOSIS — F329 Major depressive disorder, single episode, unspecified: Secondary | ICD-10-CM | POA: Diagnosis not present

## 2020-01-29 DIAGNOSIS — Z0000 Encounter for general adult medical examination without abnormal findings: Secondary | ICD-10-CM | POA: Diagnosis not present

## 2020-01-29 DIAGNOSIS — F17200 Nicotine dependence, unspecified, uncomplicated: Secondary | ICD-10-CM | POA: Diagnosis not present

## 2020-01-29 DIAGNOSIS — R7309 Other abnormal glucose: Secondary | ICD-10-CM | POA: Diagnosis not present

## 2020-02-12 DIAGNOSIS — F17200 Nicotine dependence, unspecified, uncomplicated: Secondary | ICD-10-CM | POA: Diagnosis not present

## 2020-02-12 DIAGNOSIS — E119 Type 2 diabetes mellitus without complications: Secondary | ICD-10-CM | POA: Diagnosis not present

## 2020-02-12 DIAGNOSIS — F329 Major depressive disorder, single episode, unspecified: Secondary | ICD-10-CM | POA: Diagnosis not present

## 2020-03-11 ENCOUNTER — Ambulatory Visit
Admission: RE | Admit: 2020-03-11 | Discharge: 2020-03-11 | Disposition: A | Discharge status: Home / Self Care | Payer: BC Managed Care – PPO | Source: Ambulatory Visit | Attending: Infectious Diseases | Admitting: Infectious Diseases

## 2020-03-11 ENCOUNTER — Other Ambulatory Visit: Payer: Self-pay

## 2020-03-11 DIAGNOSIS — M545 Low back pain, unspecified: Secondary | ICD-10-CM | POA: Diagnosis not present

## 2020-03-11 DIAGNOSIS — M6281 Muscle weakness (generalized): Secondary | ICD-10-CM | POA: Diagnosis not present

## 2020-03-11 DIAGNOSIS — Z1231 Encounter for screening mammogram for malignant neoplasm of breast: Secondary | ICD-10-CM | POA: Insufficient documentation

## 2020-03-12 ENCOUNTER — Inpatient Hospital Stay
Admission: RE | Admit: 2020-03-12 | Discharge: 2020-03-12 | Disposition: A | Discharge status: Home / Self Care | Payer: Self-pay | Source: Ambulatory Visit | Attending: *Deleted | Admitting: *Deleted

## 2020-03-12 ENCOUNTER — Other Ambulatory Visit: Payer: Self-pay | Admitting: *Deleted

## 2020-03-12 DIAGNOSIS — Z1231 Encounter for screening mammogram for malignant neoplasm of breast: Secondary | ICD-10-CM

## 2020-03-18 ENCOUNTER — Other Ambulatory Visit: Payer: Self-pay | Admitting: Infectious Diseases

## 2020-03-18 DIAGNOSIS — N6320 Unspecified lump in the left breast, unspecified quadrant: Secondary | ICD-10-CM

## 2020-03-18 DIAGNOSIS — R928 Other abnormal and inconclusive findings on diagnostic imaging of breast: Secondary | ICD-10-CM

## 2020-04-08 ENCOUNTER — Other Ambulatory Visit: Payer: Self-pay

## 2020-04-08 ENCOUNTER — Ambulatory Visit
Admission: RE | Admit: 2020-04-08 | Discharge: 2020-04-08 | Disposition: A | Discharge status: Home / Self Care | Payer: BC Managed Care – PPO | Source: Ambulatory Visit | Attending: Infectious Diseases | Admitting: Infectious Diseases

## 2020-04-08 DIAGNOSIS — N6320 Unspecified lump in the left breast, unspecified quadrant: Secondary | ICD-10-CM

## 2020-04-08 DIAGNOSIS — R928 Other abnormal and inconclusive findings on diagnostic imaging of breast: Secondary | ICD-10-CM

## 2020-04-12 ENCOUNTER — Other Ambulatory Visit: Payer: Self-pay | Admitting: Infectious Diseases

## 2020-04-12 DIAGNOSIS — N6320 Unspecified lump in the left breast, unspecified quadrant: Secondary | ICD-10-CM

## 2020-04-12 DIAGNOSIS — R928 Other abnormal and inconclusive findings on diagnostic imaging of breast: Secondary | ICD-10-CM

## 2020-05-13 ENCOUNTER — Emergency Department: Payer: BC Managed Care – PPO

## 2020-05-13 ENCOUNTER — Other Ambulatory Visit: Payer: Self-pay

## 2020-05-13 ENCOUNTER — Emergency Department
Admission: EM | Admit: 2020-05-13 | Discharge: 2020-05-13 | Disposition: A | Discharge status: Home / Self Care | Payer: BC Managed Care – PPO | Attending: Emergency Medicine | Admitting: Emergency Medicine

## 2020-05-13 ENCOUNTER — Encounter: Payer: Self-pay | Admitting: Emergency Medicine

## 2020-05-13 DIAGNOSIS — G43909 Migraine, unspecified, not intractable, without status migrainosus: Secondary | ICD-10-CM | POA: Insufficient documentation

## 2020-05-13 DIAGNOSIS — G43009 Migraine without aura, not intractable, without status migrainosus: Secondary | ICD-10-CM

## 2020-05-13 DIAGNOSIS — F17210 Nicotine dependence, cigarettes, uncomplicated: Secondary | ICD-10-CM | POA: Insufficient documentation

## 2020-05-13 DIAGNOSIS — R202 Paresthesia of skin: Secondary | ICD-10-CM

## 2020-05-13 DIAGNOSIS — E119 Type 2 diabetes mellitus without complications: Secondary | ICD-10-CM | POA: Insufficient documentation

## 2020-05-13 DIAGNOSIS — H538 Other visual disturbances: Secondary | ICD-10-CM | POA: Diagnosis not present

## 2020-05-13 HISTORY — DX: Type 2 diabetes mellitus without complications: E11.9

## 2020-05-13 LAB — CBC
HCT: 44.7 % (ref 36.0–46.0)
Hemoglobin: 14.8 g/dL (ref 12.0–15.0)
MCH: 28.2 pg (ref 26.0–34.0)
MCHC: 33.1 g/dL (ref 30.0–36.0)
MCV: 85.3 fL (ref 80.0–100.0)
Platelets: 245 10*3/uL (ref 150–400)
RBC: 5.24 MIL/uL — ABNORMAL HIGH (ref 3.87–5.11)
RDW: 13.5 % (ref 11.5–15.5)
WBC: 10.7 10*3/uL — ABNORMAL HIGH (ref 4.0–10.5)
nRBC: 0 % (ref 0.0–0.2)

## 2020-05-13 LAB — DIFFERENTIAL
Abs Immature Granulocytes: 0.05 10*3/uL (ref 0.00–0.07)
Basophils Absolute: 0.1 10*3/uL (ref 0.0–0.1)
Basophils Relative: 1 %
Eosinophils Absolute: 0.3 10*3/uL (ref 0.0–0.5)
Eosinophils Relative: 2 %
Immature Granulocytes: 1 %
Lymphocytes Relative: 38 %
Lymphs Abs: 4.1 10*3/uL — ABNORMAL HIGH (ref 0.7–4.0)
Monocytes Absolute: 0.7 10*3/uL (ref 0.1–1.0)
Monocytes Relative: 7 %
Neutro Abs: 5.6 10*3/uL (ref 1.7–7.7)
Neutrophils Relative %: 51 %

## 2020-05-13 LAB — COMPREHENSIVE METABOLIC PANEL
ALT: 26 U/L (ref 0–44)
AST: 34 U/L (ref 15–41)
Albumin: 4.4 g/dL (ref 3.5–5.0)
Alkaline Phosphatase: 97 U/L (ref 38–126)
Anion gap: 13 (ref 5–15)
BUN: 9 mg/dL (ref 6–20)
CO2: 25 mmol/L (ref 22–32)
Calcium: 9.2 mg/dL (ref 8.9–10.3)
Chloride: 101 mmol/L (ref 98–111)
Creatinine, Ser: 0.89 mg/dL (ref 0.44–1.00)
GFR, Estimated: >60 mL/min (ref >60)
Glucose, Bld: 107 mg/dL — ABNORMAL HIGH (ref 70–99)
Potassium: 4.3 mmol/L (ref 3.5–5.1)
Sodium: 139 mmol/L (ref 135–145)
Total Bilirubin: 0.6 mg/dL (ref 0.3–1.2)
Total Protein: 7.9 g/dL (ref 6.5–8.1)

## 2020-05-13 LAB — CBG MONITORING, ED: Glucose-Capillary: 103 mg/dL — ABNORMAL HIGH (ref 70–99)

## 2020-05-13 LAB — PROTIME-INR
INR: 0.9 (ref 0.8–1.2)
Prothrombin Time: 12 seconds (ref 11.4–15.2)

## 2020-05-13 LAB — APTT: aPTT: 26 seconds (ref 24–36)

## 2020-05-13 LAB — TROPONIN I (HIGH SENSITIVITY): Troponin I (High Sensitivity): 4 ng/L (ref <18)

## 2020-05-13 MED ORDER — BUTALBITAL-APAP-CAFFEINE 50-325-40 MG PO TABS: 1.0000 | ORAL_TABLET | Freq: Four times a day (QID) | ORAL | 0 refills | Status: AC | PRN

## 2020-05-13 MED ORDER — LORAZEPAM 2 MG/ML IJ SOLN
1.0000 mg | Freq: Once | INTRAMUSCULAR | Status: AC
  Administered 2020-05-13: 1 mg via INTRAVENOUS
  Filled 2020-05-13: qty 1

## 2020-05-13 MED ORDER — SODIUM CHLORIDE 0.9% FLUSH
3.0000 mL | Freq: Once | INTRAVENOUS | Status: AC
  Administered 2020-05-13: 3 mL via INTRAVENOUS

## 2020-05-13 NOTE — Discharge Instructions (Addendum)
As we discussed, I highly encourage you to try to decrease your Excedrin use.  This can lead to long-term dependence on the medication as well as hearing loss.  For now, I have prescribed Fioricet to take as needed.  Continue your ibuprofen for your back as this could help with a possible component of neck pain causing your left-sided arm symptoms.

## 2020-05-13 NOTE — ED Triage Notes (Signed)
Pt in via POV, brought over from [facility], reports numbness/tingling to left hand with radiation up arm, complaints of light headedness, "shooting pains" to right parietal area, blurred vision.  Onset of symptoms yesterday, approximately 1400.  Pt A/Ox4, otherwise neurologically intact.  Vitals WDL, NAD at this time.

## 2020-05-13 NOTE — ED Provider Notes (Signed)
Assumed care from Dr. Fuller Plan at 3 PM. Briefly, the patient is a 55 y.o. female with PMHx of  has a past medical history of Acid reflux, Anxiety, Depression, Diabetes mellitus without complication (HCC), Headache, Hot flashes, Hyperlipidemia, and Migraine. here with numbness/tingling and vision changes with headache. DDx includes atypical complex migraine, vs CVA. Sx ongoing so doubt TIA. MR pending. If MR negative, likely headache related and can f/u with Neurology.   Labs Reviewed  CBC - Abnormal; Notable for the following components:      Result Value   WBC 10.7 (*)    RBC 5.24 (*)    All other components within normal limits  DIFFERENTIAL - Abnormal; Notable for the following components:   Lymphs Abs 4.1 (*)    All other components within normal limits  COMPREHENSIVE METABOLIC PANEL - Abnormal; Notable for the following components:   Glucose, Bld 107 (*)    All other components within normal limits  CBG MONITORING, ED - Abnormal; Notable for the following components:   Glucose-Capillary 103 (*)    All other components within normal limits  PROTIME-INR  APTT  CBG MONITORING, ED  TROPONIN I (HIGH SENSITIVITY)    Course of Care: MRI negative.  Of note, the patient takes aspirin and Excedrin Migraine daily for her headaches.  Suspect there could be a component of chronic medication dependence contributing to her ongoing headaches, and she does report some intermittent ear ringing that has been going on for several months.  She has seen her PCP for this but would encourage her to decrease her aspirin use.  No signs of salicylate toxicity on lab work.  Given her negative MRI and reassuring exam, with known migraine history, will treat for possible complex migraines and outpatient neurology referral.  She is somewhat resistant to following up with a headache clinic.  I discussed the importance of seeing a neurologist to prevent ongoing dependence on her analgesics as well as appropriate treatment  and work-up, and will refer her to outpatient care.     Shaune Pollack, MD 05/13/20 [phone]

## 2020-05-13 NOTE — ED Provider Notes (Signed)
[facility] Emergency Department Provider Note  ____________________________________________   Event Date/Time   First MD Initiated Contact with Patient 05/13/20 1322     (approximate)  I have reviewed the triage vital signs and the nursing notes.   HISTORY  Chief Complaint Numbness    HPI Sonya Evans is a 55 y.o. female with diabetes, depression who comes in for tingling.  Patient reports having some numbness in her left hand that goes all the way up into her arm that started yesterday, constant, nothing makes it better, nothing makes it worse.  Does report some shooting headaches with migraines but already took 2 Excedrin's and her headache is now resolved.  Denies any chest pain or shortness of breath but the tingling sensation goes down into her upper back area.  Denies any trauma.  Patient was sent here from urgent care to rule out stroke.  Symptoms been going on for greater than 24 hours.  Patient reports some blurry vision that she initially said was worse in the left eye but then a few minutes later said it was worse in the right eye.            Past Medical History:  Diagnosis Date  . Acid reflux   . Anxiety   . Depression   . Diabetes mellitus without complication (HCC)   . Headache   . Hot flashes   . Hyperlipidemia   . Migraine     Patient Active Problem List   Diagnosis Date Noted  . Mixed irritable bowel syndrome 11/14/2017  . HLD (hyperlipidemia) 08/09/2015  . GERD (gastroesophageal reflux disease) 08/09/2015  . Hot flashes 08/09/2015  . Smoker 08/09/2015  . Anxiety 10/02/2014  . Arm pain 10/02/2014  . Clinical depression 10/02/2014  . Back pain, chronic 10/02/2014    Past Surgical History:  Procedure Laterality Date  . ABDOMINAL HYSTERECTOMY  2012  . COLONOSCOPY    . COLONOSCOPY WITH PROPOFOL N/A 05/17/2016   Procedure: COLONOSCOPY WITH PROPOFOL;  Surgeon: Kieth Brightly, MD;  Location: [facility] ENDOSCOPY;   Service: Endoscopy;  Laterality: N/A;  . FOOT SURGERY      Prior to Admission medications   Medication Sig Start Date End Date Taking? Authorizing Provider  ANORO ELLIPTA 62.5-25 MCG/INH AEPB INHALE 1 PUFF BY MOUTH ONCE DAILY 05/19/19   Althea Charon, Netta Neat, DO  atorvastatin (LIPITOR) 20 MG tablet TAKE 1 TABLET BY MOUTH ONCE DAILY 06/01/19   Althea Charon, Netta Neat, DO  azithromycin (ZITHROMAX) 250 MG tablet Take one tablet twice today.  Then, take 1 tablet daily for 4 days. 06/26/18   Galen Manila, NP  gabapentin (NEURONTIN) 400 MG capsule TAKE 1 CAPSULE BY MOUTH AT BEDTIME 05/19/19   Karamalegos, Netta Neat, DO  hydrocortisone (ANUSOL-HC) 25 MG suppository Place 1 suppository (25 mg total) rectally 2 (two) times daily. 12/26/17   Galen Manila, NP  ipratropium (ATROVENT) 0.06 % nasal spray Place 2 sprays into both nostrils 4 (four) times daily for 5 days. 12/26/17 12/31/17  Galen Manila, NP  mirtazapine (REMERON) 45 MG tablet TAKE 1 TABLET BY MOUTH ONCE DAILY 06/01/19   Althea Charon, Netta Neat, DO  omeprazole (PRILOSEC) 40 MG capsule TAKE 1 CAPSULE BY MOUTH ONCE DAILY 06/01/19   Althea Charon, Netta Neat, DO  PROAIR HFA 108 [phone] Base) MCG/ACT inhaler Inhale 1-2 puffs into the lungs every 6 (six) hours as needed for wheezing or shortness of breath. 06/26/18   Galen Manila, NP  QUEtiapine (SEROQUEL) 200  MG tablet TAKE 1 TABLET BY MOUTH AT BEDTIME 05/19/19   Karamalegos, Netta Neat, DO    Allergies Desvenlafaxine and Lurasidone  Family History  Problem Relation Age of Onset  . Hyperlipidemia Mother   . Neuropathy Mother   . Arthritis Father   . Hyperlipidemia Brother   . Cancer Maternal Grandmother        breast   . Alzheimer's disease Paternal Grandmother   . Breast cancer Paternal Grandmother     Social History Social History   Tobacco Use  . Smoking status: Current Every Day Smoker    Packs/day: 1.00    Types: Cigarettes  . Smokeless tobacco:  Never Used  Vaping Use  . Vaping Use: Never used  Substance Use Topics  . Alcohol use: No  . Drug use: No      Review of Systems Constitutional: No fever/chills Eyes: Positive blurry vision ENT: No sore throat. Cardiovascular: Denies chest pain. Respiratory: Denies shortness of breath. Gastrointestinal: No abdominal pain.  No nausea, no vomiting.  No diarrhea.  No constipation. Genitourinary: Negative for dysuria. Musculoskeletal: Negative for back pain.  Numbness tingling Skin: Negative for rash. Neurological: Negative for headaches, focal weakness or numbness. All other ROS negative ____________________________________________   PHYSICAL EXAM:  VITAL SIGNS: ED Triage Vitals  Enc Vitals Group     BP 05/13/20 1259 (!) 146/95     Pulse Rate 05/13/20 1259 87     Resp 05/13/20 1259 15     Temp 05/13/20 1259 98.1 F (36.7 C)     Temp Source 05/13/20 1259 Oral     SpO2 05/13/20 1259 100 %     Weight 05/13/20 1300 204 lb (92.5 kg)     Height 05/13/20 1300 5\' 6"  (1.676 m)     Head Circumference --      Peak Flow --      Pain Score 05/13/20 1300 0     Pain Loc --      Pain Edu? --      Excl. in [place]? --     Constitutional: Alert and oriented. Well appearing and in no acute distress. Eyes: Conjunctivae are normal. EOMI. good peripheral vision Head: Atraumatic. Nose: No congestion/rhinnorhea. Mouth/Throat: Mucous membranes are moist.   Neck: No stridor. Trachea Midline. FROM Cardiovascular: Normal rate, regular rhythm. Grossly normal heart sounds.  Good peripheral circulation. Respiratory: Normal respiratory effort.  No retractions. Lungs CTAB. Gastrointestinal: Soft and nontender. No distention. No abdominal bruits.  Musculoskeletal: No lower extremity tenderness nor edema.  No joint effusions. Neurologic: Sensation changes reported by patient on the left side of her face and the left arm.  Equal grip strength.  Equal strength in the legs.  No facial droop.  No aphasia.   Skin:  Skin is warm, dry and intact. No rash noted. Psychiatric: Mood and affect are normal. Speech and behavior are normal. GU: Deferred   ____________________________________________   LABS (all labs ordered are listed, but only abnormal results are displayed)  Labs Reviewed  CBC - Abnormal; Notable for the following components:      Result Value   WBC 10.7 (*)    RBC 5.24 (*)    All other components within normal limits  DIFFERENTIAL - Abnormal; Notable for the following components:   Lymphs Abs 4.1 (*)    All other components within normal limits  COMPREHENSIVE METABOLIC PANEL - Abnormal; Notable for the following components:   Glucose, Bld 107 (*)    All other components within normal  limits  CBG MONITORING, ED - Abnormal; Notable for the following components:   Glucose-Capillary 103 (*)    All other components within normal limits  PROTIME-INR  APTT  CBG MONITORING, ED  TROPONIN I (HIGH SENSITIVITY)   ____________________________________________   ED ECG REPORT I, Concha Se, the attending physician, personally viewed and interpreted this ECG.  Normal sinus rate of 92, no st elevation, no twi, normal intervals. ____________________________________________  RADIOLOGY Vela Prose, personally viewed and evaluated these images (plain radiographs) as part of my medical decision making, as well as reviewing the written report by the radiologist.  ED MD interpretation:  No   Official radiology report(s): DG Chest Portable 1 View  Result Date: 05/13/2020 CLINICAL DATA:  Left arm pain EXAM: PORTABLE CHEST 1 VIEW COMPARISON:  12/26/2013 FINDINGS: The heart size and mediastinal contours are within normal limits. Both lungs are clear. The visualized skeletal structures are unremarkable. IMPRESSION: No active disease. Electronically Signed   By: Helyn Numbers MD   On: 05/13/2020 14:16    ____________________________________________   PROCEDURES  Procedure(s)  performed (including Critical Care):  Procedures   ____________________________________________   INITIAL IMPRESSION / ASSESSMENT AND PLAN / ED COURSE  ZERLINE SPIVA was evaluated in Emergency Department on 05/13/2020 for the symptoms described in the history of present illness. She was evaluated in the context of the global COVID-19 pandemic, which necessitated consideration that the patient might be at risk for infection with the SARS-CoV-2 virus that causes COVID-19. Institutional protocols and algorithms that pertain to the evaluation of patients at risk for COVID-19 are in a state of rapid change based on information released by regulatory bodies including the CDC and federal and state organizations. These policies and algorithms were followed during the patient's care in the ED.    Patient is a 55 year old who comes in with left arm tingling sensation as well as some blurry vision and occasional headaches.  Could be complex migraine although her headache is now resolved so I think would be best to proceed with CT imaging or MRI to further evaluate.  Fortunately her CT scanner is limited to 1 and has been acquired delay for CT scan so I did order the MRI concurrently.  Patient was taken for MRI prior to CT scan which I think is reasonable given she has no falls have low suspicion for hemorrhage but I have more concern for the possibility of stroke and I would need an MRI to further evaluate for that.  Lower suspicion for mass or MS given this is only occurred this time.  I did add on EKG and cardiac markers just to make sure it was an atypical presentation of ACS and these were negative.  Her chest x-ray does not show any widened mediastinum.  She got equal pulses in her wrist and feet and I have low suspicion for dissection given no chest pain and the above.  Patient handed off to oncoming team pending MRI       ____________________________________________   FINAL CLINICAL  IMPRESSION(S) / ED DIAGNOSES   Final diagnoses:  Paresthesia      MEDICATIONS GIVEN DURING THIS VISIT:  Medications  sodium chloride flush (NS) 0.9 % injection 3 mL (3 mLs Intravenous Given 05/13/20 1448)  LORazepam (ATIVAN) injection 1 mg (1 mg Intravenous Given 05/13/20 1448)     ED Discharge Orders    None       Note:  This document was prepared using Dragon voice recognition  software and may include unintentional dictation errors.   Concha Se, MD 05/13/20 [phone]

## 2020-05-13 NOTE — ED Notes (Signed)
Patient transported to MRI 

## 2020-05-13 NOTE — ED Triage Notes (Signed)
Pt  Comes from [facility] with c/o numbness, tingling and dizziness that started yesterday. Pt states she is diabetic but no issues the sugars.

## 2020-08-09 ENCOUNTER — Other Ambulatory Visit: Payer: Self-pay | Admitting: Family Medicine

## 2020-08-09 DIAGNOSIS — M5412 Radiculopathy, cervical region: Secondary | ICD-10-CM

## 2020-08-16 ENCOUNTER — Other Ambulatory Visit: Payer: Self-pay

## 2020-08-16 ENCOUNTER — Ambulatory Visit
Admission: RE | Admit: 2020-08-16 | Discharge: 2020-08-16 | Disposition: A | Discharge status: Home / Self Care | Payer: BC Managed Care – PPO | Source: Ambulatory Visit | Attending: Family Medicine | Admitting: Family Medicine

## 2020-08-16 DIAGNOSIS — M5412 Radiculopathy, cervical region: Secondary | ICD-10-CM | POA: Diagnosis not present

## 2020-10-21 ENCOUNTER — Ambulatory Visit
Admission: RE | Admit: 2020-10-21 | Discharge: 2020-10-21 | Disposition: A | Discharge status: Home / Self Care | Payer: BC Managed Care – PPO | Source: Ambulatory Visit | Attending: Infectious Diseases | Admitting: Infectious Diseases

## 2020-10-21 ENCOUNTER — Other Ambulatory Visit: Payer: Self-pay

## 2020-10-21 DIAGNOSIS — N6320 Unspecified lump in the left breast, unspecified quadrant: Secondary | ICD-10-CM | POA: Diagnosis present

## 2020-10-21 DIAGNOSIS — R928 Other abnormal and inconclusive findings on diagnostic imaging of breast: Secondary | ICD-10-CM | POA: Diagnosis not present

## 2020-10-22 ENCOUNTER — Other Ambulatory Visit: Payer: Self-pay | Admitting: Infectious Diseases

## 2020-10-27 ENCOUNTER — Other Ambulatory Visit: Payer: Self-pay | Admitting: Infectious Diseases

## 2020-10-27 DIAGNOSIS — R928 Other abnormal and inconclusive findings on diagnostic imaging of breast: Secondary | ICD-10-CM

## 2020-10-27 DIAGNOSIS — N6320 Unspecified lump in the left breast, unspecified quadrant: Secondary | ICD-10-CM

## 2020-10-27 DIAGNOSIS — Z1231 Encounter for screening mammogram for malignant neoplasm of breast: Secondary | ICD-10-CM

## 2021-06-16 ENCOUNTER — Other Ambulatory Visit: Payer: Self-pay

## 2021-06-16 ENCOUNTER — Ambulatory Visit
Admission: RE | Admit: 2021-06-16 | Discharge: 2021-06-16 | Disposition: A | Discharge status: Home / Self Care | Payer: BC Managed Care – PPO | Source: Ambulatory Visit | Attending: Infectious Diseases | Admitting: Infectious Diseases

## 2021-06-16 DIAGNOSIS — Z1231 Encounter for screening mammogram for malignant neoplasm of breast: Secondary | ICD-10-CM | POA: Insufficient documentation

## 2021-06-16 DIAGNOSIS — R928 Other abnormal and inconclusive findings on diagnostic imaging of breast: Secondary | ICD-10-CM | POA: Diagnosis present

## 2021-06-16 DIAGNOSIS — N6320 Unspecified lump in the left breast, unspecified quadrant: Secondary | ICD-10-CM | POA: Insufficient documentation

## 2021-10-21 ENCOUNTER — Other Ambulatory Visit: Payer: Self-pay

## 2021-10-21 DIAGNOSIS — Z122 Encounter for screening for malignant neoplasm of respiratory organs: Secondary | ICD-10-CM

## 2021-10-21 DIAGNOSIS — F17210 Nicotine dependence, cigarettes, uncomplicated: Secondary | ICD-10-CM

## 2021-10-21 DIAGNOSIS — Z87891 Personal history of nicotine dependence: Secondary | ICD-10-CM

## 2021-10-26 ENCOUNTER — Emergency Department: Payer: BC Managed Care – PPO

## 2021-10-26 ENCOUNTER — Emergency Department
Admission: EM | Admit: 2021-10-26 | Discharge: 2021-10-26 | Disposition: A | Discharge status: Home / Self Care | Payer: BC Managed Care – PPO | Attending: Emergency Medicine | Admitting: Emergency Medicine

## 2021-10-26 ENCOUNTER — Other Ambulatory Visit: Payer: Self-pay

## 2021-10-26 DIAGNOSIS — K219 Gastro-esophageal reflux disease without esophagitis: Secondary | ICD-10-CM | POA: Diagnosis not present

## 2021-10-26 DIAGNOSIS — Z20822 Contact with and (suspected) exposure to covid-19: Secondary | ICD-10-CM | POA: Insufficient documentation

## 2021-10-26 DIAGNOSIS — K2900 Acute gastritis without bleeding: Secondary | ICD-10-CM | POA: Insufficient documentation

## 2021-10-26 DIAGNOSIS — R1010 Upper abdominal pain, unspecified: Secondary | ICD-10-CM | POA: Diagnosis present

## 2021-10-26 LAB — URINALYSIS, ROUTINE W REFLEX MICROSCOPIC
Bacteria, UA: NONE SEEN
Bilirubin Urine: NEGATIVE
Glucose, UA: NEGATIVE mg/dL
Ketones, ur: NEGATIVE mg/dL
Leukocytes,Ua: NEGATIVE
Nitrite: NEGATIVE
Protein, ur: NEGATIVE mg/dL
Specific Gravity, Urine: 1.031 — ABNORMAL HIGH (ref 1.005–1.030)
pH: 6 (ref 5.0–8.0)

## 2021-10-26 LAB — CBC
HCT: 46.2 % — ABNORMAL HIGH (ref 36.0–46.0)
Hemoglobin: 15.3 g/dL — ABNORMAL HIGH (ref 12.0–15.0)
MCH: 28.5 pg (ref 26.0–34.0)
MCHC: 33.1 g/dL (ref 30.0–36.0)
MCV: 86 fL (ref 80.0–100.0)
Platelets: 216 10*3/uL (ref 150–400)
RBC: 5.37 MIL/uL — ABNORMAL HIGH (ref 3.87–5.11)
RDW: 13.2 % (ref 11.5–15.5)
WBC: 12.2 10*3/uL — ABNORMAL HIGH (ref 4.0–10.5)
nRBC: 0 % (ref 0.0–0.2)

## 2021-10-26 LAB — COMPREHENSIVE METABOLIC PANEL
ALT: 20 U/L (ref 0–44)
AST: 20 U/L (ref 15–41)
Albumin: 4.2 g/dL (ref 3.5–5.0)
Alkaline Phosphatase: 92 U/L (ref 38–126)
Anion gap: 8 (ref 5–15)
BUN: 7 mg/dL (ref 6–20)
CO2: 25 mmol/L (ref 22–32)
Calcium: 9 mg/dL (ref 8.9–10.3)
Chloride: 105 mmol/L (ref 98–111)
Creatinine, Ser: 1.05 mg/dL — ABNORMAL HIGH (ref 0.44–1.00)
GFR, Estimated: >60 mL/min (ref >60)
Glucose, Bld: 114 mg/dL — ABNORMAL HIGH (ref 70–99)
Potassium: 4.8 mmol/L (ref 3.5–5.1)
Sodium: 138 mmol/L (ref 135–145)
Total Bilirubin: 0.8 mg/dL (ref 0.3–1.2)
Total Protein: 7.6 g/dL (ref 6.5–8.1)

## 2021-10-26 LAB — LIPASE, BLOOD: Lipase: 45 U/L (ref 11–51)

## 2021-10-26 LAB — SARS CORONAVIRUS 2 BY RT PCR: SARS Coronavirus 2 by RT PCR: NEGATIVE

## 2021-10-26 LAB — TROPONIN I (HIGH SENSITIVITY)
Troponin I (High Sensitivity): 6 ng/L (ref <18)
Troponin I (High Sensitivity): 4 ng/L (ref <18)

## 2021-10-26 MED ORDER — SODIUM CHLORIDE 0.9 % IV SOLN
12.5000 mg | Freq: Once | INTRAVENOUS | Status: AC
  Administered 2021-10-26: 12.5 mg via INTRAVENOUS

## 2021-10-26 MED ORDER — PROMETHAZINE HCL 25 MG/ML IJ SOLN
12.5000 mg | Freq: Once | INTRAMUSCULAR | Status: DC
Start: 2021-10-26 — End: 2021-10-26

## 2021-10-26 MED ORDER — SODIUM CHLORIDE 0.9 % IV SOLN: 25.0000 mg | Freq: Once | INTRAVENOUS | Status: DC

## 2021-10-26 MED ORDER — SODIUM CHLORIDE 0.9 % IV SOLN
25.0000 mg | Freq: Once | INTRAVENOUS | Status: DC
  Filled 2021-10-26: qty 1

## 2021-10-26 MED ORDER — IOHEXOL 300 MG/ML  SOLN
100.0000 mL | Freq: Once | INTRAMUSCULAR | Status: AC | PRN
  Administered 2021-10-26: 100 mL via INTRAVENOUS

## 2021-10-26 MED ORDER — SODIUM CHLORIDE 0.9 % IV SOLN
12.5000 mg | Freq: Once | INTRAVENOUS | Status: DC
  Filled 2021-10-26: qty 0.5

## 2021-10-26 MED ORDER — ONDANSETRON HCL 4 MG/2ML IJ SOLN
4.0000 mg | Freq: Once | INTRAMUSCULAR | Status: DC
Start: 2021-10-26 — End: 2021-10-26
  Filled 2021-10-26: qty 2

## 2021-10-26 MED ORDER — LIDOCAINE VISCOUS HCL 2 % MT SOLN
15.0000 mL | Freq: Once | OROMUCOSAL | Status: AC
Start: 2021-10-26 — End: 2021-10-26
  Administered 2021-10-26: 15 mL via OROMUCOSAL
  Filled 2021-10-26: qty 15

## 2021-10-26 MED ORDER — FAMOTIDINE IN NACL 20-0.9 MG/50ML-% IV SOLN
20.0000 mg | Freq: Once | INTRAVENOUS | Status: AC
  Administered 2021-10-26: 20 mg via INTRAVENOUS
  Filled 2021-10-26: qty 50

## 2021-10-26 MED ORDER — ALUM & MAG HYDROXIDE-SIMETH 200-200-20 MG/5ML PO SUSP
30.0000 mL | Freq: Once | ORAL | Status: AC
Start: 2021-10-26 — End: 2021-10-26
  Administered 2021-10-26: 30 mL via ORAL
  Filled 2021-10-26: qty 30

## 2021-10-26 NOTE — ED Triage Notes (Signed)
Pt in with co mid abd pain that radiates to right abd that started today, vomited x 1. NO hx of the same, sent here from [place] clinic.

## 2021-10-26 NOTE — Discharge Instructions (Addendum)
Continue to take your Prilosec.  If your symptoms are not improving please follow-up with both your primary care doctor and the gastroenterologist as you may need to have an endoscopy to make sure you do not have an ulcer.

## 2021-10-26 NOTE — ED Provider Notes (Addendum)
[facility] Emergency Department Provider Note     Event Date/Time   First MD Initiated Contact with Patient 10/26/21 1131     (approximate)   History   Abdominal Pain   HPI  Sonya Evans is a 56 y.o. female with a history of hyperlipidemia, anxiety, mixed IBS and GERD, presents to the ED for evaluation of upper quadrant abdominal pain that began today. She notes onset after taking her Rebelysus this morning. She does endorse waking up feeling nauseated. Patient reports abdominal pain that radiates across her upper quadrants with 1 episode of nonbloody, nonbilious emesis.  She denies any bladder or bowel changes at this time.     Physical Exam   Triage Vital Signs: ED Triage Vitals [10/26/21 1049]  Enc Vitals Group     BP 122/77     Pulse Rate 95     Resp 20     Temp 98.6 F (37 C)     Temp Source Oral     SpO2 95 %     Weight 184 lb (83.5 kg)     Height 5\' 6"  (1.676 m)     Head Circumference      Peak Flow      Pain Score 10     Pain Loc      Pain Edu?      Excl. in [place]?     Most recent vital signs: Vitals:   10/26/21 1440 10/26/21 1530  BP:  116/76  Pulse:  87  Resp:  (!) 30  Temp: 98.5 F (36.9 C)   SpO2:  93%    General Awake, no distress.  CV:  Good peripheral perfusion.  RESP:  Normal effort.  ABD:  No distention. Mildly tender to palp across the upper abdominal quadrants. No lower abd  pain. No CVA tenderness    ED Results / Procedures / Treatments   Labs (all labs ordered are listed, but only abnormal results are displayed) Labs Reviewed  CBC - Abnormal; Notable for the following components:      Result Value   WBC 12.2 (*)    RBC 5.37 (*)    Hemoglobin 15.3 (*)    HCT 46.2 (*)    All other components within normal limits  COMPREHENSIVE METABOLIC PANEL - Abnormal; Notable for the following components:   Glucose, Bld 114 (*)    Creatinine, Ser 1.05 (*)    All other components within normal limits   URINALYSIS, ROUTINE W REFLEX MICROSCOPIC - Abnormal; Notable for the following components:   Color, Urine YELLOW (*)    APPearance CLEAR (*)    Specific Gravity, Urine 1.031 (*)    Hgb urine dipstick SMALL (*)    All other components within normal limits  SARS CORONAVIRUS 2 BY RT PCR  LIPASE, BLOOD  TROPONIN I (HIGH SENSITIVITY)  TROPONIN I (HIGH SENSITIVITY)     EKG  Vent. rate 83 BPM PR interval 182 ms QRS duration 98 ms QT/QTcB 393/462 ms P-R-T axes 89 101 28 No STEMI  RADIOLOGY  I personally viewed and evaluated these images as part of my medical decision making, as well as reviewing the written report by the radiologist.  ED Provider Interpretation: no acute findings}  US ABDOMEN LIMITED RUQ (LIVER/GB)  Result Date: 10/26/2021 CLINICAL DATA:  Abdominal pain EXAM: ULTRASOUND ABDOMEN LIMITED RIGHT UPPER QUADRANT COMPARISON:  None Available. FINDINGS: Gallbladder: No gallstones or wall thickening visualized. Gallbladder is distended. There is no fluid around  the gallbladder. No sonographic Murphy sign noted by sonographer. Common bile duct: Diameter: 3 mm Liver: There is increased echogenicity suggesting fatty infiltration. No focal abnormalities are seen. Portal vein is patent on color Doppler imaging with normal direction of blood flow towards the liver. Other: None. IMPRESSION: Fatty liver. No other significant sonographic abnormality is seen in right upper quadrant. Electronically Signed   By: Ernie Avena M.D.   On: 10/26/2021 14:20   CT ABDOMEN PELVIS W CONTRAST  Result Date: 10/26/2021 CLINICAL DATA:  Nausea vomiting and abdominal pain. CT November 30, 2015 EXAM: CT ABDOMEN AND PELVIS WITH CONTRAST TECHNIQUE: Multidetector CT imaging of the abdomen and pelvis was performed using the standard protocol following bolus administration of intravenous contrast. RADIATION DOSE REDUCTION: This exam was performed according to the departmental dose-optimization program which  includes automated exposure control, adjustment of the mA and/or kV according to patient size and/or use of iterative reconstruction technique. CONTRAST:  OMNIPAQUE IOHEXOL 300 MG/ML  SOLN COMPARISON:  November 30, 2015 CT. FINDINGS: Lower chest: No acute abnormality. Hepatobiliary: Diffuse hepatic steatosis. Gallbladder is unremarkable. No biliary ductal dilation. Pancreas: No pancreatic ductal dilation or evidence of acute inflammation. Spleen: No splenomegaly. Adrenals/Urinary Tract: Nodular thickening of the right-greater-than-left medial limbs of the adrenal glands is similar dating back to November 30, 2015 likely reflecting hyperplasia/tiny benign adenomas but considered benign requiring no imaging follow-up. No hydronephrosis. Kidneys demonstrate symmetric enhancement and excretion of contrast material. Urinary bladder is unremarkable for degree of distension. Stomach/Bowel: No radiopaque enteric contrast material was administered. Small hiatal hernia. Stomach is nondistended limiting evaluation. No pathologic dilation of small or large bowel. Normal appendix. No evidence of acute bowel inflammation. Vascular/Lymphatic: Aortic atherosclerosis without abdominal aortic aneurysm. No pathologically enlarged abdominal or pelvic lymph nodes. Reproductive: Status post hysterectomy. No adnexal masses. Other: No significant abdominopelvic free fluid. Musculoskeletal: No acute osseous abnormality. IMPRESSION: No acute abnormality identified in the abdomen or pelvis. Electronically Signed   By: Maudry Mayhew M.D.   On: 10/26/2021 13:15     PROCEDURES:  Critical Care performed: No  Procedures   MEDICATIONS ORDERED IN ED: Medications  promethazine (PHENERGAN) 12.5 mg in sodium chloride 0.9 % 50 mL infusion (0 mg Intravenous Stopped 10/26/21 1310)    And  promethazine (PHENERGAN) 12.5 mg in sodium chloride 0.9 % 50 mL infusion (0 mg Intravenous Stopped 10/26/21 1310)  iohexol (OMNIPAQUE) 300 MG/ML solution  100 mL (100 mLs Intravenous Contrast Given 10/26/21 1254)  famotidine (PEPCID) IVPB 20 mg premix (0 mg Intravenous Stopped 10/26/21 1551)  alum & mag hydroxide-simeth (MAALOX/MYLANTA) 200-200-20 MG/5ML suspension 30 mL (30 mLs Oral Given 10/26/21 1510)  lidocaine (XYLOCAINE) 2 % viscous mouth solution 15 mL (15 mLs Mouth/Throat Given 10/26/21 1510)     IMPRESSION / MDM / ASSESSMENT AND PLAN / ED COURSE  I reviewed the triage vital signs and the nursing notes.                              Differential diagnosis includes, but is not limited to, biliary disease (biliary colic, acute cholecystitis, cholangitis, choledocholithiasis, etc), intrathoracic causes for epigastric abdominal pain including ACS, gastritis, duodenitis, pancreatitis, small bowel or large bowel obstruction, abdominal aortic aneurysm, hernia, and ulcer(s).   Patient's presentation is most consistent with acute complicated illness / injury requiring diagnostic workup.  The patient is on the cardiac monitor to evaluate for evidence of arrhythmia and/or significant heart rate changes.  Patient to the ED for sudden onset of epigastric abdominal pain with associated nausea.  Her evaluation and work-up overall reassuring at this time.  No CT ultrasound evidence to correlate with the patient's symptoms.  Patient did have a mild leukocytosis at 12.2.  UA was without evidence of leukocyturia, coronavirus test was negative, troponin negative x2.  No EKG evidence of any malignant arrhythmia or STEMI.  Patient with improved symptoms after GI cocktail and IV famotidine.  Patient's diagnosis is consistent with epigastric abdominal pain likely due to gastritis. Patient will be discharged home with prescriptions for pantoprazole. Patient is to follow up with her primary provider as needed or otherwise directed. Patient is given ED precautions to return to the ED for any worsening or new symptoms.     FINAL CLINICAL IMPRESSION(S) / ED DIAGNOSES    Final diagnoses:  Acute gastritis without hemorrhage, unspecified gastritis type  Gastroesophageal reflux disease without esophagitis     Rx / DC Orders   ED Discharge Orders     None        Note:  This document was prepared using Dragon voice recognition software and may include unintentional dictation errors.    Lissa Hoard, PA-C 10/26/21 1635    [id number] Charlesetta Ivory, PA-C 10/26/21 1635    Concha Se, MD 10/29/21 1250

## 2021-10-26 NOTE — ED Triage Notes (Signed)
First Nurse Note;  Pt via POV from [facility]. Pt c/o RLQ pain with tenderness on palpitations. Pt c/o nausea also and vomiting. Pt is A&Ox4 and NAD at the time of arrival.

## 2021-11-11 ENCOUNTER — Encounter: Payer: Self-pay | Admitting: Acute Care

## 2021-11-11 ENCOUNTER — Ambulatory Visit (INDEPENDENT_AMBULATORY_CARE_PROVIDER_SITE_OTHER): Payer: BC Managed Care – PPO | Admitting: Acute Care

## 2021-11-11 DIAGNOSIS — F17210 Nicotine dependence, cigarettes, uncomplicated: Secondary | ICD-10-CM

## 2021-11-11 NOTE — Progress Notes (Signed)
Virtual Visit via Telephone Note  I connected with Sonya Evans on 02/15/21 at  2:00 PM EST by telephone and verified that I am speaking with the correct person using two identifiers.  Location: Patient: Home Provider: Working from    I discussed the limitations, risks, security and privacy concerns of performing an evaluation and management service by telephone and the availability of in person appointments. I also discussed with the patient that there may be a patient responsible charge related to this service. The patient expressed understanding and agreed to proceed.  Shared Decision Making Visit Lung Cancer Screening Program [phone])   Eligibility: Age 56 y.o. Pack Years Smoking History Calculation 32 (# packs/per year x # years smoked) Recent History of coughing up blood  no Unexplained weight loss? no ( >Than 15 pounds within the last 6 months ) Prior History Lung / other cancer no (Diagnosis within the last 5 years already requiring surveillance chest CT Scans). Smoking Status Current Smoker Former Smokers: Years since quit: NA  Quit Date: NA  Visit Components: Discussion included one or more decision making aids. yes Discussion included risk/benefits of screening. yes Discussion included potential follow up diagnostic testing for abnormal scans. yes Discussion included meaning and risk of over diagnosis. yes Discussion included meaning and risk of False Positives. yes Discussion included meaning of total radiation exposure. yes  Counseling Included: Importance of adherence to annual lung cancer LDCT screening. yes Impact of comorbidities on ability to participate in the program. yes Ability and willingness to under diagnostic treatment. yes  Smoking Cessation Counseling: Current Smokers:  Discussed importance of smoking cessation. yes Information about tobacco cessation classes and interventions provided to patient. yes Patient provided with "ticket" for LDCT  Scan. yes Symptomatic Patient. yes  Counseling(Intermediate counseling: > three minutes) 99406 Diagnosis Code: Tobacco Use Z72.0 Asymptomatic Patient no  Counseling NA Former Smokers:  Discussed the importance of maintaining cigarette abstinence. yes Diagnosis Code: Personal History of Nicotine Dependence. O27.741 Information about tobacco cessation classes and interventions provided to patient. Yes Patient provided with "ticket" for LDCT Scan. yes Written Order for Lung Cancer Screening with LDCT placed in Epic. Yes (CT Chest Lung Cancer Screening Low Dose W/O CM) [id number] Z12.2-Screening of respiratory organs Z87.891-Personal history of nicotine dependence   I spent 25 minutes of face to face time with her discussing the risks and benefits of lung cancer screening. We viewed a power point together that explained in detail the above noted topics. We took the time to pause the power point at intervals to allow for questions to be asked and answered to ensure understanding. We discussed that she had taken the single most powerful action possible to decrease her risk of developing lung cancer when she quit smoking. I counseled her to remain smoke free, and to contact me if she ever had the desire to smoke again so that I can provide resources and tools to help support the effort to remain smoke free. We discussed the time and location of the scan, and that either  Sonya Miyamoto RN or I will call with the results within  24-48 hours of receiving them. She has my card and contact information in the event she needs to speak with me, in addition to a copy of the power point we reviewed as a resource. She verbalized understanding of all of the above and had no further questions upon leaving the office.     I explained to the patient that there has been a  high incidence of coronary artery disease noted on these exams. I explained that this is a non-gated exam therefore degree or severity cannot be  determined. This patient is on statin therapy. I have asked the patient to follow-up with their PCP regarding any incidental finding of coronary artery disease and management with diet or medication as they feel is clinically indicated. The patient verbalized understanding of the above and had no further questions.   I spent 3 minutes counseling on smoking cessation and the health risks of continued tobacco abuse   Sonya Evans D. [name], NP-C [facility] Personal contact information can be found on Amion  11/11/2021, 9:39 AM

## 2021-11-11 NOTE — Patient Instructions (Signed)
Thank you for participating in the [facility]. It was our pleasure to meet you today. We will call you with the results of your scan within the next few days. Your scan will be assigned a Lung RADS category score by the physicians reading the scans.  This Lung RADS score determines follow up scanning.  See below for description of categories, and follow up screening recommendations. We will be in touch to schedule your follow up screening annually or based on recommendations of our providers. We will fax a copy of your scan results to your Primary Care Physician, or the physician who referred you to the program, to ensure they have the results. Please call the office if you have any questions or concerns regarding your scanning experience or results.  Our office number is [phone]. Please speak with Denise Phelps, RN. , or  Denise Buckner RN, They are  our Lung Cancer Screening RN.'s If They are unavailable when you call, Please leave a message on the voice mail. We will return your call at our earliest convenience.This voice mail is monitored several times a day.  Remember, if your scan is normal, we will scan you annually as long as you continue to meet the criteria for the program. (Age 54-77, Current smoker or smoker who has quit within the last 15 years). If you are a smoker, remember, quitting is the single most powerful action that you can take to decrease your risk of lung cancer and other pulmonary, breathing related problems. We know quitting is hard, and we are here to help.  Please let us know if there is anything we can do to help you meet your goal of quitting. If you are a former smoker, congratulations. We are proud of you! Remain smoke free! Remember you can refer friends or family members through the number above.  We will screen them to make sure they meet criteria for the program. Thank you for helping us take better care of you by  participating in Lung Screening.  You can receive free nicotine replacement therapy ( patches, gum or mints) by calling 1-800-QUIT NOW. Please call so we can get you on the path to becoming  a non-smoker. I know it is hard, but you can do this!  Lung RADS Categories:  Lung RADS 1: no nodules or definitely non-concerning nodules.  Recommendation is for a repeat annual scan in 12 months.  Lung RADS 2:  nodules that are non-concerning in appearance and behavior with a very low likelihood of becoming an active cancer. Recommendation is for a repeat annual scan in 12 months.  Lung RADS 3: nodules that are probably non-concerning , includes nodules with a low likelihood of becoming an active cancer.  Recommendation is for a 6-month repeat screening scan. Often noted after an upper respiratory illness. We will be in touch to make sure you have no questions, and to schedule your 6-month scan.  Lung RADS 4 A: nodules with concerning findings, recommendation is most often for a follow up scan in 3 months or additional testing based on our provider's assessment of the scan. We will be in touch to make sure you have no questions and to schedule the recommended 3 month follow up scan.  Lung RADS 4 B:  indicates findings that are concerning. We will be in touch with you to schedule additional diagnostic testing based on our provider's  assessment of the scan.  Other options for assistance in smoking cessation (   As covered by your insurance benefits)  Hypnosis for smoking cessation  [employer] [phone]  Acupuncture for smoking cessation  [facility] [phone]   

## 2021-11-15 ENCOUNTER — Encounter: Payer: BC Managed Care – PPO | Admitting: Acute Care

## 2021-11-17 ENCOUNTER — Ambulatory Visit
Admission: RE | Admit: 2021-11-17 | Discharge: 2021-11-17 | Disposition: A | Discharge status: Home / Self Care | Payer: BC Managed Care – PPO | Source: Ambulatory Visit | Attending: Acute Care | Admitting: Acute Care

## 2021-11-17 DIAGNOSIS — Z87891 Personal history of nicotine dependence: Secondary | ICD-10-CM | POA: Insufficient documentation

## 2021-11-17 DIAGNOSIS — Z122 Encounter for screening for malignant neoplasm of respiratory organs: Secondary | ICD-10-CM | POA: Insufficient documentation

## 2021-11-17 DIAGNOSIS — F17210 Nicotine dependence, cigarettes, uncomplicated: Secondary | ICD-10-CM | POA: Diagnosis present

## 2021-11-21 ENCOUNTER — Other Ambulatory Visit: Payer: Self-pay | Admitting: Acute Care

## 2021-11-21 DIAGNOSIS — F17210 Nicotine dependence, cigarettes, uncomplicated: Secondary | ICD-10-CM

## 2021-11-21 DIAGNOSIS — Z87891 Personal history of nicotine dependence: Secondary | ICD-10-CM

## 2021-11-21 DIAGNOSIS — Z122 Encounter for screening for malignant neoplasm of respiratory organs: Secondary | ICD-10-CM

## 2022-05-29 ENCOUNTER — Other Ambulatory Visit: Payer: Self-pay | Admitting: Infectious Diseases

## 2022-05-29 DIAGNOSIS — Z1231 Encounter for screening mammogram for malignant neoplasm of breast: Secondary | ICD-10-CM

## 2022-07-12 ENCOUNTER — Ambulatory Visit
Admission: RE | Admit: 2022-07-12 | Discharge: 2022-07-12 | Disposition: A | Discharge status: Home / Self Care | Payer: BC Managed Care – PPO | Source: Ambulatory Visit | Attending: Infectious Diseases | Admitting: Infectious Diseases

## 2022-07-12 DIAGNOSIS — Z1231 Encounter for screening mammogram for malignant neoplasm of breast: Secondary | ICD-10-CM | POA: Diagnosis present

## 2022-09-13 ENCOUNTER — Other Ambulatory Visit: Payer: Self-pay | Admitting: Acute Care

## 2022-09-13 DIAGNOSIS — Z122 Encounter for screening for malignant neoplasm of respiratory organs: Secondary | ICD-10-CM

## 2022-09-13 DIAGNOSIS — Z87891 Personal history of nicotine dependence: Secondary | ICD-10-CM

## 2022-09-13 DIAGNOSIS — F17210 Nicotine dependence, cigarettes, uncomplicated: Secondary | ICD-10-CM

## 2022-11-20 ENCOUNTER — Ambulatory Visit: Payer: BC Managed Care – PPO

## 2022-11-29 ENCOUNTER — Ambulatory Visit
Admission: RE | Admit: 2022-11-29 | Discharge: 2022-11-29 | Disposition: A | Discharge status: Home / Self Care | Payer: BC Managed Care – PPO | Source: Ambulatory Visit | Attending: Acute Care | Admitting: Acute Care

## 2022-11-29 DIAGNOSIS — Z122 Encounter for screening for malignant neoplasm of respiratory organs: Secondary | ICD-10-CM | POA: Diagnosis present

## 2022-11-29 DIAGNOSIS — F17210 Nicotine dependence, cigarettes, uncomplicated: Secondary | ICD-10-CM | POA: Insufficient documentation

## 2022-11-29 DIAGNOSIS — Z87891 Personal history of nicotine dependence: Secondary | ICD-10-CM | POA: Diagnosis present

## 2022-12-08 ENCOUNTER — Other Ambulatory Visit: Payer: Self-pay

## 2022-12-08 DIAGNOSIS — Z87891 Personal history of nicotine dependence: Secondary | ICD-10-CM

## 2022-12-08 DIAGNOSIS — F17210 Nicotine dependence, cigarettes, uncomplicated: Secondary | ICD-10-CM

## 2022-12-08 DIAGNOSIS — Z122 Encounter for screening for malignant neoplasm of respiratory organs: Secondary | ICD-10-CM

## 2023-08-15 ENCOUNTER — Other Ambulatory Visit: Payer: Self-pay | Admitting: Infectious Diseases

## 2023-08-15 DIAGNOSIS — Z1231 Encounter for screening mammogram for malignant neoplasm of breast: Secondary | ICD-10-CM

## 2023-09-19 ENCOUNTER — Ambulatory Visit
Admission: RE | Admit: 2023-09-19 | Discharge: 2023-09-19 | Disposition: A | Discharge status: Home / Self Care | Source: Ambulatory Visit | Attending: Infectious Diseases | Admitting: Infectious Diseases

## 2023-09-19 DIAGNOSIS — Z1231 Encounter for screening mammogram for malignant neoplasm of breast: Secondary | ICD-10-CM | POA: Diagnosis present

## 2023-10-16 ENCOUNTER — Other Ambulatory Visit: Payer: Self-pay | Admitting: Acute Care

## 2023-10-16 DIAGNOSIS — Z87891 Personal history of nicotine dependence: Secondary | ICD-10-CM

## 2023-10-16 DIAGNOSIS — F17210 Nicotine dependence, cigarettes, uncomplicated: Secondary | ICD-10-CM

## 2023-10-16 DIAGNOSIS — Z122 Encounter for screening for malignant neoplasm of respiratory organs: Secondary | ICD-10-CM

## 2023-12-26 ENCOUNTER — Ambulatory Visit
Admission: RE | Admit: 2023-12-26 | Discharge: 2023-12-26 | Disposition: A | Discharge status: Home / Self Care | Source: Ambulatory Visit | Attending: Acute Care | Admitting: Acute Care

## 2023-12-26 DIAGNOSIS — F17210 Nicotine dependence, cigarettes, uncomplicated: Secondary | ICD-10-CM | POA: Diagnosis present

## 2023-12-26 DIAGNOSIS — Z87891 Personal history of nicotine dependence: Secondary | ICD-10-CM | POA: Insufficient documentation

## 2023-12-26 DIAGNOSIS — Z122 Encounter for screening for malignant neoplasm of respiratory organs: Secondary | ICD-10-CM | POA: Diagnosis present

## 2024-01-02 ENCOUNTER — Other Ambulatory Visit: Payer: Self-pay

## 2024-01-02 DIAGNOSIS — F17210 Nicotine dependence, cigarettes, uncomplicated: Secondary | ICD-10-CM

## 2024-01-02 DIAGNOSIS — Z122 Encounter for screening for malignant neoplasm of respiratory organs: Secondary | ICD-10-CM

## 2024-01-02 DIAGNOSIS — Z87891 Personal history of nicotine dependence: Secondary | ICD-10-CM

## 2024-02-09 IMAGING — MG DIGITAL DIAGNOSTIC BILAT W/ TOMO W/ CAD
8 series · 8 of 24 positions shown · non-contrast
Comparison: Previous exam(s).

CLINICAL DATA: Short-term follow-up for a probably benign left
breast mass, initially assessed diagnostic imaging on 04/08/2020.

EXAM:
DIGITAL DIAGNOSTIC BILATERAL MAMMOGRAM WITH TOMOSYNTHESIS AND CAD;
ULTRASOUND LEFT BREAST LIMITED
TECHNIQUE: Bilateral digital diagnostic mammography and breast tomosynthesis
was performed. The images were evaluated with computer-aided
detection.; Targeted ultrasound examination of the left breast was
performed.

[R MLO synth-2D]
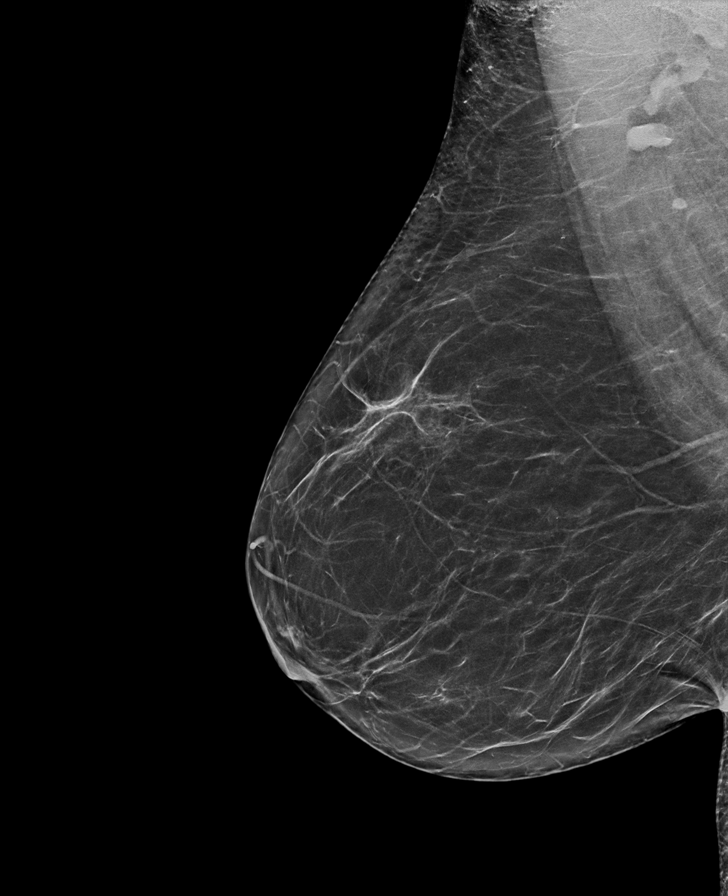

[L CC synth-2D]
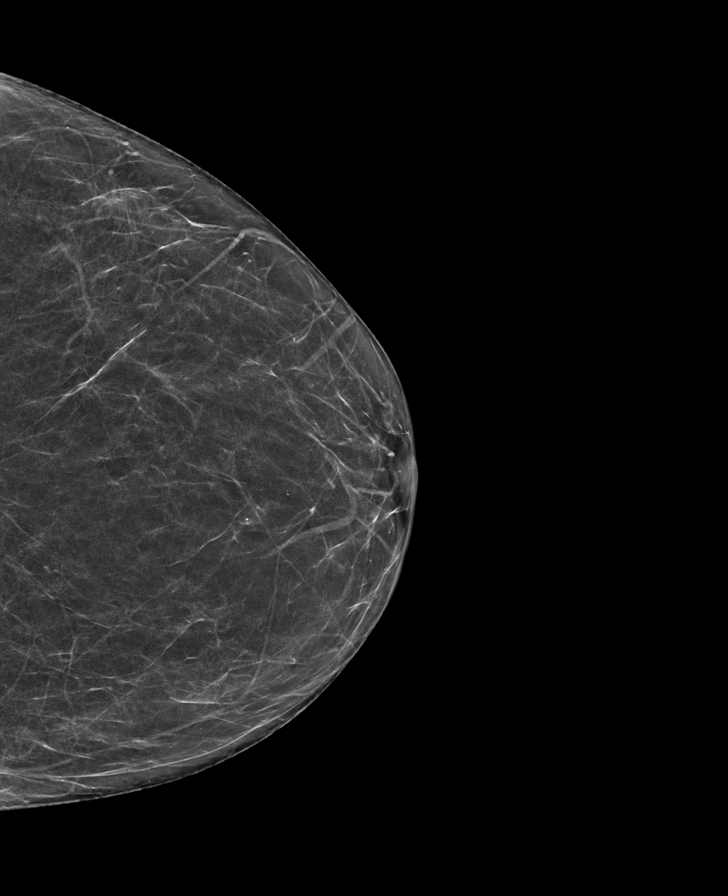

[L MLO synth-2D]
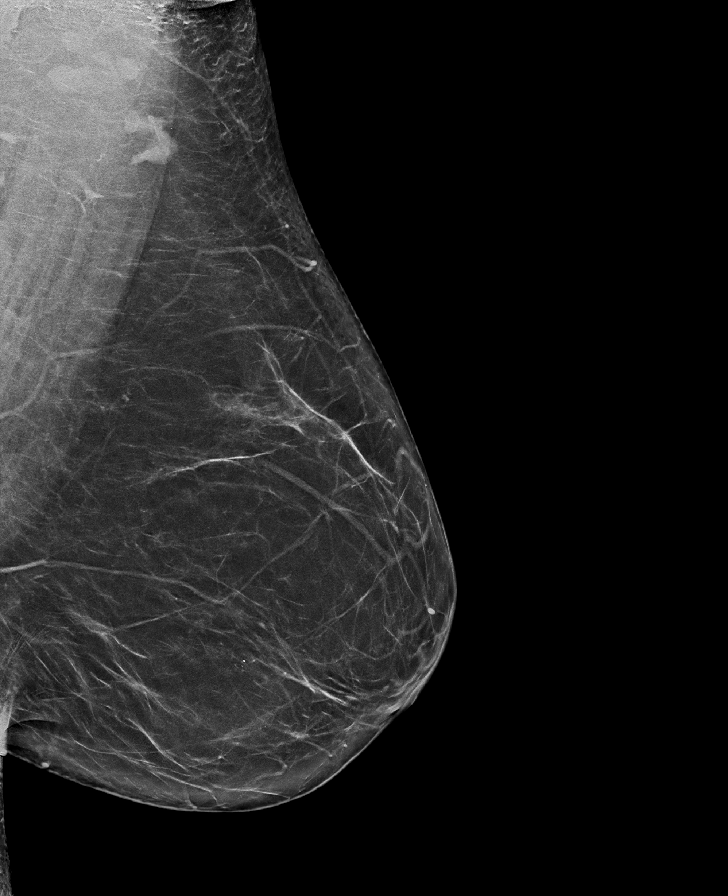

[R CC synth-2D]
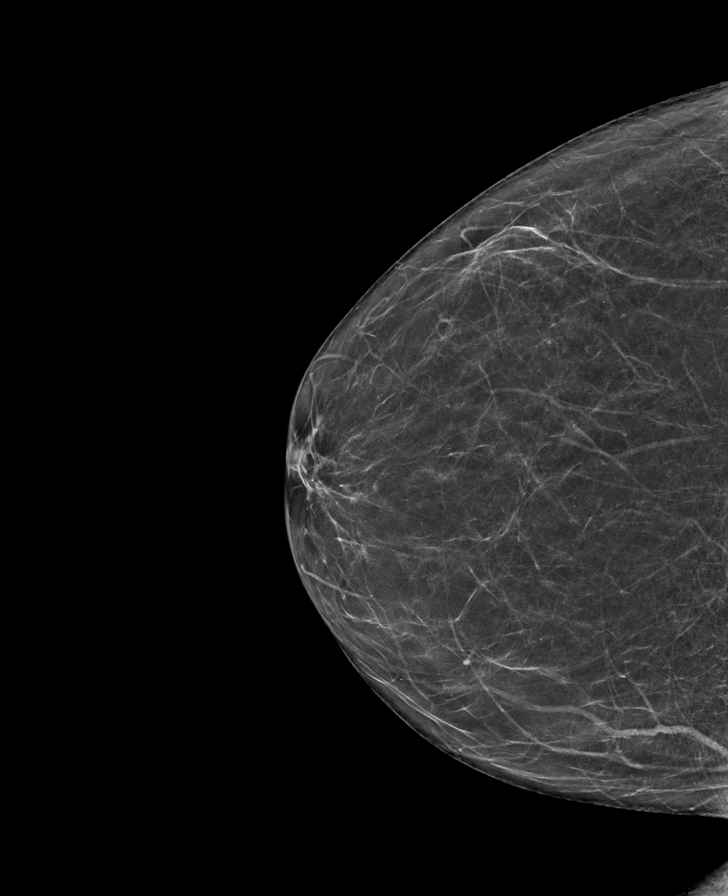

[L CC tomo · tomo slice 31/61.0]
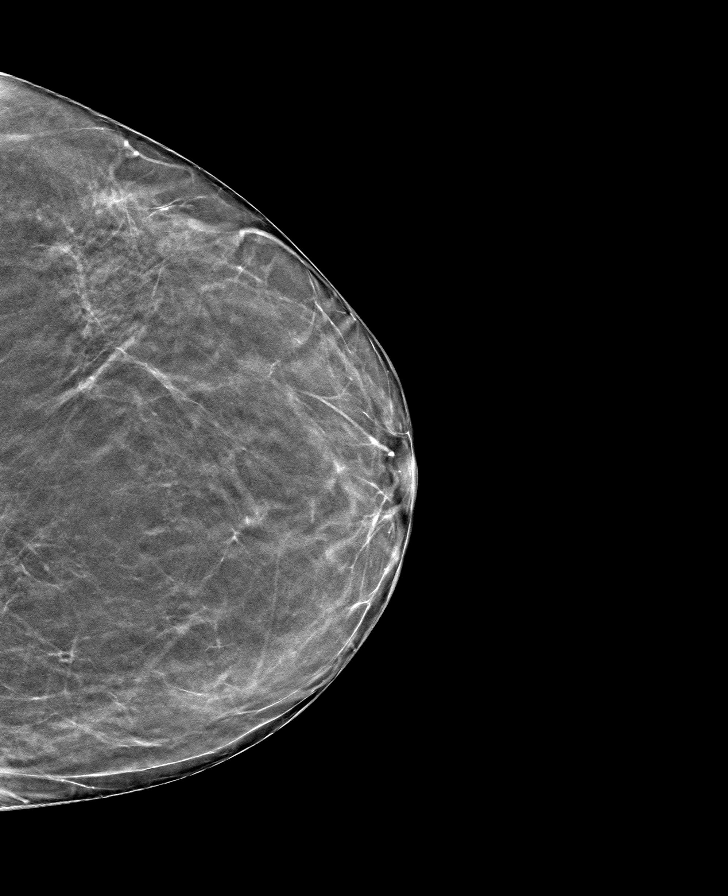

[L MLO tomo · tomo slice 40/79.0]
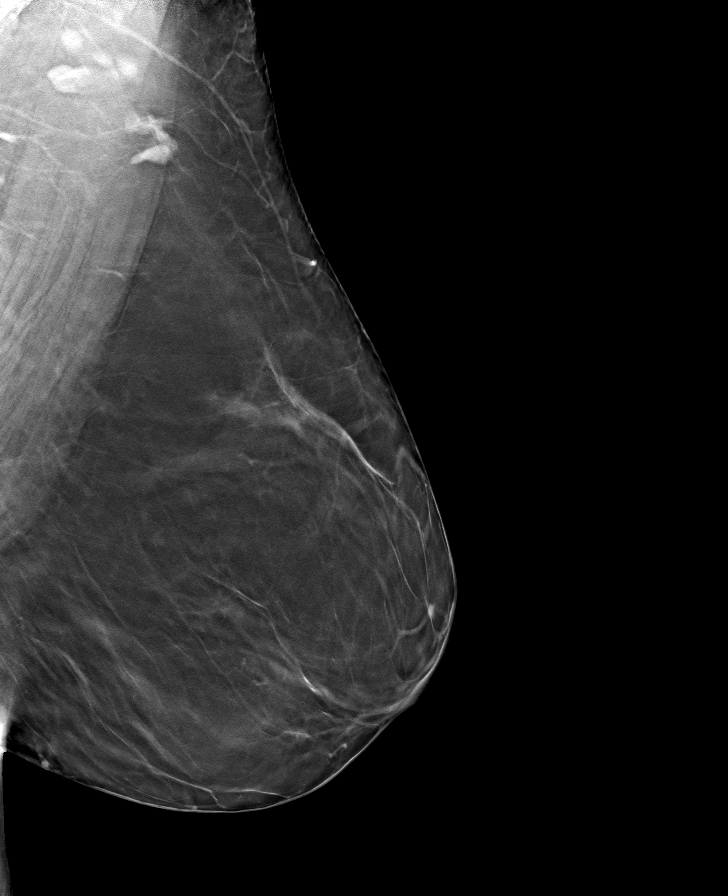

[R MLO tomo · tomo slice 39/77.0]
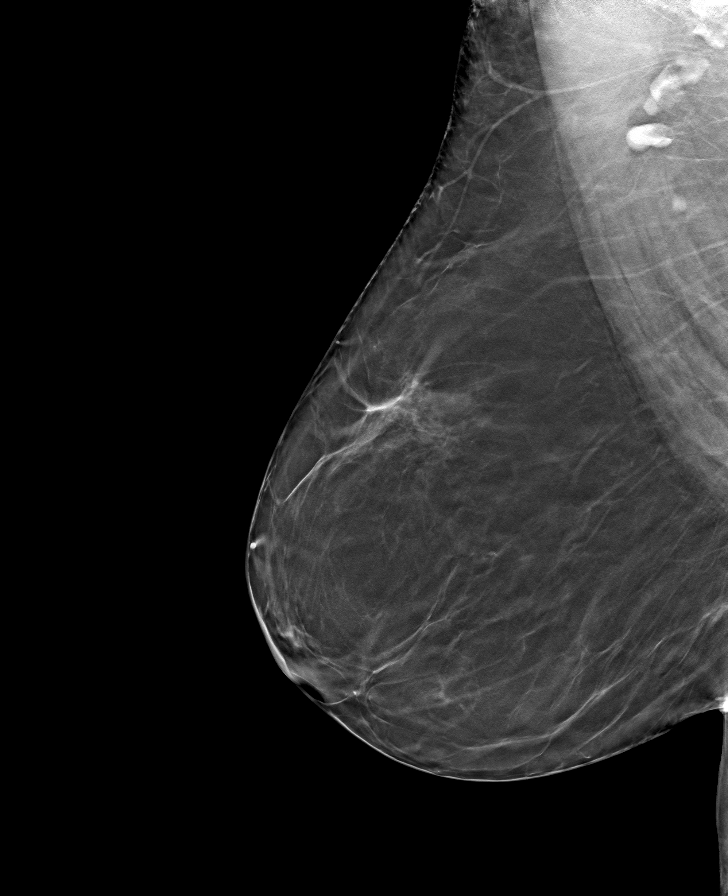

[R CC tomo · tomo slice 31/62.0]
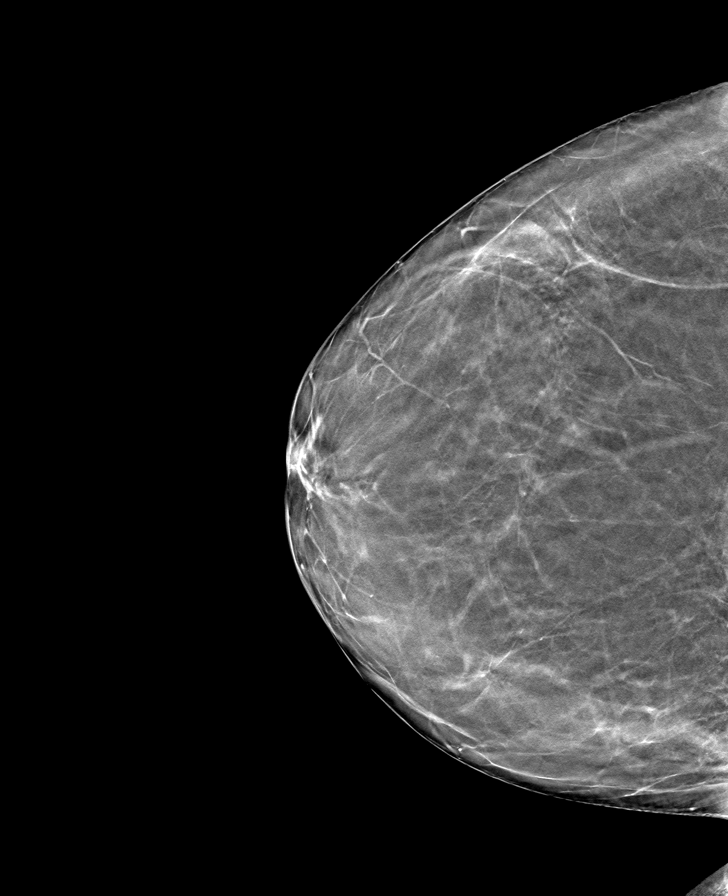

[8 of 24 positions shown; findings below may reference images not displayed]

ACR Breast Density Category b: There are scattered areas of
fibroglandular density.
FINDINGS: On the cc view, the 2 mm mass noted in the lateral left breast,
posterior depth, is unchanged. It is not, however, visualized on the
current MLO images. There are no new masses, no areas of
architectural distortion and no suspicious calcifications.

Targeted left breast ultrasound is performed, showing normal tissue
in the lateral left breast at 3 o'clock, 9 cm the nipple. A tiny
cystic lesion noted on the prior exams could not be reproduced.
There are no masses or suspicious lesions.
IMPRESSION: 1. No evidence of breast malignancy.
2. Tiny, benign and stable mass-like opacity in the posterior,
lateral left breast, stable for over 1 year. This may have
represented a different finding than the tiny cyst noted on the
prior ultrasounds. The finding on the prior ultrasounds is no longer
visualized.

RECOMMENDATION:
Screening mammogram in one year.(Code:[id number])

I have discussed the findings and recommendations with the patient.
If applicable, a reminder letter will be sent to the patient
regarding the next appointment.

BI-RADS CATEGORY  2: Benign.

## 2024-02-22 ENCOUNTER — Other Ambulatory Visit: Payer: Self-pay

## 2024-02-22 MED ORDER — IVABRADINE HCL 5 MG PO TABS
2.5000 mg | ORAL_TABLET | Freq: Every day | ORAL | 3 refills | Status: DC
  Filled 2024-02-22: qty 15, 30d supply, fill #0
